# Patient Record
Sex: Male | Born: 1980 | Race: Black or African American | Hispanic: No | Marital: Married | Smoking: Never smoker
Health system: Southern US, Community
[De-identification: ages and names within clinical notes are randomized; demographics above are authoritative.]

## PROBLEM LIST (undated history)

## (undated) DIAGNOSIS — D352 Benign neoplasm of pituitary gland: Secondary | ICD-10-CM

---

## 2016-09-06 DIAGNOSIS — Z23 Encounter for immunization: Secondary | ICD-10-CM | POA: Diagnosis not present

## 2016-09-06 DIAGNOSIS — Z0000 Encounter for general adult medical examination without abnormal findings: Secondary | ICD-10-CM | POA: Diagnosis not present

## 2019-11-04 ENCOUNTER — Inpatient Hospital Stay (HOSPITAL_COMMUNITY)
Admission: EM | Admit: 2019-11-04 | Discharge: 2019-11-07 | DRG: 645 | Disposition: A | Discharge status: Home / Self Care | Payer: No Typology Code available for payment source | Attending: Internal Medicine | Admitting: Internal Medicine

## 2019-11-04 ENCOUNTER — Other Ambulatory Visit: Payer: Self-pay

## 2019-11-04 ENCOUNTER — Encounter (HOSPITAL_COMMUNITY): Payer: Self-pay | Admitting: *Deleted

## 2019-11-04 DIAGNOSIS — D352 Benign neoplasm of pituitary gland: Secondary | ICD-10-CM | POA: Diagnosis not present

## 2019-11-04 DIAGNOSIS — R519 Headache, unspecified: Secondary | ICD-10-CM | POA: Diagnosis present

## 2019-11-04 DIAGNOSIS — Z791 Long term (current) use of non-steroidal anti-inflammatories (NSAID): Secondary | ICD-10-CM

## 2019-11-04 DIAGNOSIS — H4901 Third [oculomotor] nerve palsy, right eye: Secondary | ICD-10-CM | POA: Diagnosis present

## 2019-11-04 DIAGNOSIS — H02401 Unspecified ptosis of right eyelid: Secondary | ICD-10-CM | POA: Diagnosis present

## 2019-11-04 DIAGNOSIS — E236 Other disorders of pituitary gland: Secondary | ICD-10-CM | POA: Diagnosis present

## 2019-11-04 DIAGNOSIS — Z20822 Contact with and (suspected) exposure to covid-19: Secondary | ICD-10-CM | POA: Diagnosis present

## 2019-11-04 DIAGNOSIS — Z01818 Encounter for other preprocedural examination: Secondary | ICD-10-CM

## 2019-11-04 DIAGNOSIS — G9389 Other specified disorders of brain: Secondary | ICD-10-CM

## 2019-11-04 HISTORY — DX: Benign neoplasm of pituitary gland: D35.2

## 2019-11-04 MED ORDER — TETRACAINE HCL 0.5 % OP SOLN
2.0000 [drp] | Freq: Once | OPHTHALMIC | Status: AC
  Administered 2019-11-05: 2 [drp] via OPHTHALMIC
  Filled 2019-11-04: qty 4

## 2019-11-04 MED ORDER — FLUORESCEIN SODIUM 1 MG OP STRP
1.0000 | ORAL_STRIP | Freq: Once | OPHTHALMIC | Status: AC
  Administered 2019-11-05: 1 via OPHTHALMIC

## 2019-11-04 NOTE — ED Triage Notes (Signed)
Pt says that he is currently being treated for a sinus infection, on abx. Says that on Sunday he had some sensitivity to light in the right eye. Since Monday, he says he cannot keep his eye open, unless he covers his left eye-the right eye will open naturally. Double vision if he has both eyes open. Right sided headache

## 2019-11-05 ENCOUNTER — Emergency Department (HOSPITAL_COMMUNITY): Payer: No Typology Code available for payment source

## 2019-11-05 ENCOUNTER — Inpatient Hospital Stay (HOSPITAL_COMMUNITY): Payer: No Typology Code available for payment source

## 2019-11-05 ENCOUNTER — Encounter (HOSPITAL_COMMUNITY): Payer: Self-pay | Admitting: Internal Medicine

## 2019-11-05 DIAGNOSIS — H4901 Third [oculomotor] nerve palsy, right eye: Secondary | ICD-10-CM | POA: Diagnosis present

## 2019-11-05 DIAGNOSIS — E236 Other disorders of pituitary gland: Secondary | ICD-10-CM | POA: Diagnosis not present

## 2019-11-05 DIAGNOSIS — H02401 Unspecified ptosis of right eyelid: Secondary | ICD-10-CM | POA: Diagnosis present

## 2019-11-05 DIAGNOSIS — G9389 Other specified disorders of brain: Secondary | ICD-10-CM | POA: Diagnosis not present

## 2019-11-05 DIAGNOSIS — D352 Benign neoplasm of pituitary gland: Secondary | ICD-10-CM | POA: Diagnosis present

## 2019-11-05 DIAGNOSIS — R519 Headache, unspecified: Secondary | ICD-10-CM | POA: Diagnosis present

## 2019-11-05 DIAGNOSIS — Z20822 Contact with and (suspected) exposure to covid-19: Secondary | ICD-10-CM | POA: Diagnosis present

## 2019-11-05 DIAGNOSIS — Z791 Long term (current) use of non-steroidal anti-inflammatories (NSAID): Secondary | ICD-10-CM | POA: Diagnosis not present

## 2019-11-05 LAB — BASIC METABOLIC PANEL
Anion gap: 13 (ref 5–15)
BUN: 11 mg/dL (ref 6–20)
CO2: 26 mmol/L (ref 22–32)
Calcium: 9.5 mg/dL (ref 8.9–10.3)
Chloride: 97 mmol/L — ABNORMAL LOW (ref 98–111)
Creatinine, Ser: 1.04 mg/dL (ref 0.61–1.24)
GFR calc Af Amer: >60 mL/min (ref >60)
GFR calc non Af Amer: >60 mL/min (ref >60)
Glucose, Bld: 107 mg/dL — ABNORMAL HIGH (ref 70–99)
Potassium: 3.9 mmol/L (ref 3.5–5.1)
Sodium: 136 mmol/L (ref 135–145)

## 2019-11-05 LAB — CBC WITH DIFFERENTIAL/PLATELET
Abs Immature Granulocytes: 0.01 10*3/uL (ref 0.00–0.07)
Basophils Absolute: 0.1 10*3/uL (ref 0.0–0.1)
Basophils Relative: 1 %
Eosinophils Absolute: 0.2 10*3/uL (ref 0.0–0.5)
Eosinophils Relative: 3 %
HCT: 45.1 % (ref 39.0–52.0)
Hemoglobin: 15.6 g/dL (ref 13.0–17.0)
Immature Granulocytes: 0 %
Lymphocytes Relative: 47 %
Lymphs Abs: 2.7 10*3/uL (ref 0.7–4.0)
MCH: 31.8 pg (ref 26.0–34.0)
MCHC: 34.6 g/dL (ref 30.0–36.0)
MCV: 92 fL (ref 80.0–100.0)
Monocytes Absolute: 0.5 10*3/uL (ref 0.1–1.0)
Monocytes Relative: 8 %
Neutro Abs: 2.4 10*3/uL (ref 1.7–7.7)
Neutrophils Relative %: 41 %
Platelets: 189 10*3/uL (ref 150–400)
RBC: 4.9 MIL/uL (ref 4.22–5.81)
RDW: 11.8 % (ref 11.5–15.5)
WBC: 5.7 10*3/uL (ref 4.0–10.5)
nRBC: 0 % (ref 0.0–0.2)

## 2019-11-05 LAB — CSF CELL COUNT WITH DIFFERENTIAL
RBC Count, CSF: 1 /mm3 — ABNORMAL HIGH
RBC Count, CSF: 4 /mm3 — ABNORMAL HIGH
Tube #: 1
Tube #: 4
WBC, CSF: 1 /mm3 (ref 0–5)
WBC, CSF: 2 /mm3 (ref 0–5)

## 2019-11-05 LAB — CORTISOL: Cortisol, Plasma: 4.2 ug/dL

## 2019-11-05 LAB — GLUCOSE, CSF: Glucose, CSF: 61 mg/dL (ref 40–70)

## 2019-11-05 LAB — TSH: TSH: 2.221 u[IU]/mL (ref 0.350–4.500)

## 2019-11-05 LAB — PROTEIN, CSF: Total  Protein, CSF: 33 mg/dL (ref 15–45)

## 2019-11-05 LAB — SEDIMENTATION RATE: Sed Rate: 5 mm/hr (ref 0–16)

## 2019-11-05 LAB — C-REACTIVE PROTEIN: CRP: 0.6 mg/dL (ref <1.0)

## 2019-11-05 LAB — SARS CORONAVIRUS 2 BY RT PCR (HOSPITAL ORDER, PERFORMED IN ~~LOC~~ HOSPITAL LAB): SARS Coronavirus 2: NEGATIVE

## 2019-11-05 LAB — HIV ANTIBODY (ROUTINE TESTING W REFLEX): HIV Screen 4th Generation wRfx: NONREACTIVE

## 2019-11-05 MED ORDER — ACETAMINOPHEN 325 MG PO TABS: 650.0000 mg | ORAL_TABLET | Freq: Four times a day (QID) | ORAL | Status: DC | PRN

## 2019-11-05 MED ORDER — IOHEXOL 350 MG/ML SOLN
75.0000 mL | Freq: Once | INTRAVENOUS | Status: AC | PRN
  Administered 2019-11-05: 75 mL via INTRAVENOUS

## 2019-11-05 MED ORDER — OXYCODONE HCL 5 MG PO TABS: 5.0000 mg | ORAL_TABLET | ORAL | Status: DC | PRN

## 2019-11-05 MED ORDER — ACETAMINOPHEN 650 MG RE SUPP: 650.0000 mg | Freq: Four times a day (QID) | RECTAL | Status: DC | PRN

## 2019-11-05 MED ORDER — MORPHINE SULFATE (PF) 2 MG/ML IV SOLN: 2.0000 mg | INTRAVENOUS | Status: DC | PRN

## 2019-11-05 MED ORDER — ONDANSETRON HCL 4 MG PO TABS: 4.0000 mg | ORAL_TABLET | Freq: Four times a day (QID) | ORAL | Status: DC | PRN

## 2019-11-05 MED ORDER — ONDANSETRON HCL 4 MG/2ML IJ SOLN: 4.0000 mg | Freq: Four times a day (QID) | INTRAMUSCULAR | Status: DC | PRN

## 2019-11-05 MED ORDER — TUBERCULIN PPD 5 UNIT/0.1ML ID SOLN
5.0000 [IU] | Freq: Once | INTRADERMAL | Status: AC
  Administered 2019-11-05: 5 [IU] via INTRADERMAL
  Filled 2019-11-05: qty 0.1

## 2019-11-05 MED ORDER — GADOBUTROL 1 MMOL/ML IV SOLN
9.0000 mL | Freq: Once | INTRAVENOUS | Status: AC | PRN
  Administered 2019-11-05: 9 mL via INTRAVENOUS

## 2019-11-05 MED ORDER — PREDNISONE 5 MG PO TABS: 10.0000 mg | ORAL_TABLET | Freq: Every day | ORAL | Status: DC

## 2019-11-05 MED ORDER — PREDNISONE 5 MG PO TABS
10.0000 mg | ORAL_TABLET | Freq: Every day | ORAL | Status: DC
  Administered 2019-11-05: 10 mg via ORAL
  Filled 2019-11-05: qty 2

## 2019-11-05 MED ORDER — PREDNISONE 5 MG PO TABS
10.0000 mg | ORAL_TABLET | Freq: Three times a day (TID) | ORAL | Status: DC
  Administered 2019-11-05 – 2019-11-07 (×6): 10 mg via ORAL
  Filled 2019-11-05 (×6): qty 2

## 2019-11-05 NOTE — ED Notes (Signed)
Pt and his wife both given apple juice to drink. Ok per provider.

## 2019-11-05 NOTE — ED Notes (Signed)
Patient currently at MRI

## 2019-11-05 NOTE — H&P (Signed)
History and Physical    Frederick Mendez [id number] DOB: [birth year] DOA: 11/04/2019  PCP: Seward Carol, MD Consultants:  None Patient coming from:  Home - lives with wife and 3 kids (23, 22, 1); NOK: Wife, Aboubacar Reichner, [phone]  Chief Complaint: Diplopia  HPI: Frederick Mendez is a 39 y.o. male with no significant known medical history presenting with diplopia.  He reports noticing his R eyelid drooping for the last couple of days.  He also then noticed diplopia when both eyes are are open.  Denies HA or other symptoms.  He thought he had a sinus infection and went to the doctor with fever, chills, cough, congestion, sinus pressure; he was given Amoxicillin with some improvement.     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  Neurology is consulting and recommends medicine admission and steroids.  LP is being done now.  Review of Systems: As per HPI; otherwise review of systems reviewed and negative.   Ambulatory Status: Ambulates without assistance   Past Medical History:  Diagnosis Date  . Pituitary adenoma ([place])     History reviewed. No pertinent surgical history.  Social History   Socioeconomic History  . Marital status: Married    Spouse name: Not on file  . Number of children: Not on file  . Years of education: Not on file  . Highest education level: Not on file  Occupational History  . Occupation: IT  Tobacco Use  . Smoking status: Never Smoker  . Smokeless tobacco: Never Used  Substance and Sexual Activity  . Alcohol use: Yes    Comment: social  . Drug use: Not Currently  . Sexual activity: Not on file  Other Topics Concern  . Not on file  Social History Narrative  . Not on file   Social Determinants of Health   Financial Resource Strain:   . Difficulty of Paying Living Expenses:   Food Insecurity:   . Worried About Charity fundraiser in the Last Year:   . Arboriculturist in the Last Year:     Transportation Needs:   . Film/video editor (Medical):   Marland Kitchen Lack of Transportation (Non-Medical):   Physical Activity:   . Days of Exercise per Week:   . Minutes of Exercise per Session:   Stress:   . Feeling of Stress :   Social Connections:   . Frequency of Communication with Friends and Family:   . Frequency of Social Gatherings with Friends and Family:   . Attends Religious Services:   . Active Member of Clubs or Organizations:   . Attends Archivist Meetings:   Marland Kitchen Marital Status:   Intimate Partner Violence:   . Fear of Current or Ex-Partner:   . Emotionally Abused:   Marland Kitchen Physically Abused:   . Sexually Abused:     No Known Allergies  History reviewed. No pertinent family history.  Prior to Admission medications   Medication Sig Start Date End Date Taking? Authorizing Provider  amoxicillin (AMOXIL) 500 MG capsule Take 500 mg by mouth 2 (two) times daily. 11/02/19  Yes [provider]  ibuprofen (ADVIL) 200 MG tablet Take 400 mg by mouth 2 (two) times daily as needed for headache.   Yes [provider]    Physical Exam: Vitals:   11/05/19 0700 11/05/19 0800 11/05/19 1437 11/05/19 1503  BP: 132/85 (!) 141/90 128/89 127/89  Pulse: 69 78 70 63  Resp:  13 20 15 14   Temp:   99.2 F (37.3 C) 99.6 F (37.6 C)  TempSrc:   Oral Oral  SpO2: 96% 98% 99% 100%  Weight:    86.9 kg  Height:    5\' 11"  (1.803 m)     . General: Lying flat s/p LP, prefers to keep eyes closed . Eyes:  R eye with lateral and downward gaze when open, +ptosis on the R eyelid . ENT:  grossly normal hearing, lips & tongue, mmm; appropriate dentition . Neck:  no LAD, masses or thyromegaly . Cardiovascular:  RRR, no m/r/g. No LE edema.  Marland Kitchen Respiratory:   CTA bilaterally with no wheezes/rales/rhonchi.  Normal respiratory effort. . Abdomen:  soft, NT, ND, NABS . Skin:  no rash or induration seen on limited exam . Musculoskeletal:  grossly normal tone BUE/BLE, good ROM, no  bony abnormality . Psychiatric:  blunted mood and affect, speech fluent and appropriate, AOx3 . Neurologic:  CN 2-12 grossly intact, moves all extremities in coordinated fashion    Radiological Exams on Admission: MR Brain W and Wo Contrast  Result Date: 11/05/2019 CLINICAL DATA:  Light sensitivity in the right eye. Being treated for sinus infection. Double vision and right-sided headache. EXAM: MRI HEAD AND ORBITS WITHOUT AND WITH CONTRAST TECHNIQUE: Multiplanar, multiecho pulse sequences of the brain and surrounding structures were obtained without and with intravenous contrast. Multiplanar, multiecho pulse sequences of the orbits and surrounding structures were obtained including fat saturation techniques, before and after intravenous contrast administration. CONTRAST:  45mL GADAVIST GADOBUTROL 1 MMOL/ML IV SOLN COMPARISON:  None. FINDINGS: MRI HEAD FINDINGS Brain: 18 x 16 x 13 mm sellar and suprasellar mass that is indistinguishable from normal pituitary gland. The mass is enhancing and has a wasted appearance. The mass contacts the optic chiasm in the suprasellar cistern. No visible cavernous sinus invasion to correlate with double vision. No notable heterogeneity to suggest recent hemorrhage. No acute infarction, hemorrhage, hydrocephalus, extra-axial collection or white matter disease. Vascular: These normal flow voids and vascular enhancement Skull and upper cervical spine: Normal marrow signal MRI ORBITS FINDINGS Orbits:  No evidence of mass, inflammation, or venous occlusion. Visualized sinuses: Opacified right posterior ethmoid air cell and partial opacification of the left frontal sinus. Soft tissues: Negative Limited intracranial: Negative IMPRESSION: 1. 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 The mass contacts the optic chiasm. No visible cavernous sinus invasion. 2. Normal appearance of the brain itself. Electronically Signed   By: Monte Fantasia M.D.   On:  11/05/2019 05:46   DG CHEST PORT 1 VIEW  Result Date: 11/05/2019 CLINICAL DATA:  Progressive headache and diplopia. Reported pituitary mass. Preoperative assessment. EXAM: PORTABLE CHEST 1 VIEW COMPARISON:  None. FINDINGS: The lungs are clear. Heart is borderline enlarged with pulmonary vascularity normal. No adenopathy. No bone lesions. IMPRESSION: Borderline cardiac prominence.  Lungs clear.  No adenopathy evident. Electronically Signed   By: Lowella Grip III M.D.   On: 11/05/2019 10:15   MR MRV HEAD W WO CONTRAST  Result Date: 11/05/2019 CLINICAL DATA:  Double vision and headache. Being treated for sinus infection. EXAM: MR VENOGRAM HEAD WITHOUT AND WITH CONTRAST TECHNIQUE: Angiographic images of the intracranial venous structures were obtained using MRV technique without and with intravenous contrast. CONTRAST:  53mL GADAVIST GADOBUTROL 1 MMOL/ML IV SOLN COMPARISON:  None. FINDINGS: Normal enhancement of the dural venous sinuses and major deep veins. There is symmetric cavernous sinus enhancement and patent superior ophthalmic  veins in this patient with sellar mass. IMPRESSION: Negative MRV. Electronically Signed   By: Monte Fantasia M.D.   On: 11/05/2019 05:48   MR ORBITS W WO CONTRAST  Result Date: 11/05/2019 CLINICAL DATA:  Light sensitivity in the right eye. Being treated for sinus infection. Double vision and right-sided headache. EXAM: MRI HEAD AND ORBITS WITHOUT AND WITH CONTRAST TECHNIQUE: Multiplanar, multiecho pulse sequences of the brain and surrounding structures were obtained without and with intravenous contrast. Multiplanar, multiecho pulse sequences of the orbits and surrounding structures were obtained including fat saturation techniques, before and after intravenous contrast administration. CONTRAST:  78mL GADAVIST GADOBUTROL 1 MMOL/ML IV SOLN COMPARISON:  None. FINDINGS: MRI HEAD FINDINGS Brain: 18 x 16 x 13 mm sellar and suprasellar mass that is indistinguishable from normal  pituitary gland. The mass is enhancing and has a wasted appearance. The mass contacts the optic chiasm in the suprasellar cistern. No visible cavernous sinus invasion to correlate with double vision. No notable heterogeneity to suggest recent hemorrhage. No acute infarction, hemorrhage, hydrocephalus, extra-axial collection or white matter disease. Vascular: These normal flow voids and vascular enhancement Skull and upper cervical spine: Normal marrow signal MRI ORBITS FINDINGS Orbits:  No evidence of mass, inflammation, or venous occlusion. Visualized sinuses: Opacified right posterior ethmoid air cell and partial opacification of the left frontal sinus. Soft tissues: Negative Limited intracranial: Negative IMPRESSION: 1. 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 The mass contacts the optic chiasm. No visible cavernous sinus invasion. 2. Normal appearance of the brain itself. Electronically Signed   By: Monte Fantasia M.D.   On: 11/05/2019 05:46    EKG: Independently reviewed.  NSR with rate 75; nonspecific ST changes, likely early repolarization    Labs on Admission: I have personally reviewed the available labs and imaging studies at the time of the admission.  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  DVT prophylaxis:   SCDs Code Status:  Full - confirmed with patient Family Communication: None present; I left a message for his wife on voice mail at the time of admission Disposition Plan:  The patient is from: home  Anticipated d/c is to: home without [facility] services   Anticipated d/c date will depend on clinical response to treatment, but possibly as early as tomorrow if he has excellent response to treatment  Patient is currently: acutely ill Consults called: Neurosurgery; neurology Admission status:  Admit - It is my clinical opinion that admission to INPATIENT is reasonable and necessary because of the expectation that this patient will require hospital care that crosses at least 2 midnights to treat this condition based on the medical complexity of the problems presented.  Given the aforementioned information, the predictability of an adverse  outcome is felt to be significant.    Karmen Bongo MD [facility]   How to contact the [facility] Attending or Consulting provider [place] or covering provider during after hours [place], for this patient?  1. Check the care team in [facility] and look for a) attending/consulting [facility] provider listed and b) the [facility] team listed 2. Log into [email or web address] and use [place]'s universal password to access. If you do not have the password, please contact the hospital operator. 3. Locate the [facility] provider you are looking for under [facility] and page to a number that you can be directly reached. 4. If you still have difficulty reaching the provider, please page the [facility] (Director on Call) for the Hospitalists listed on amion for assistance.   11/05/2019, 3:22 PM

## 2019-11-05 NOTE — ED Notes (Signed)
Patient returned from MRI , denies pain/respirations unlabored , family at bedside , neurologist explained plan of care to patient .

## 2019-11-05 NOTE — Progress Notes (Signed)
TB test applied to left anterior forearm. Site marked. Delia Heady RN

## 2019-11-05 NOTE — ED Provider Notes (Signed)
TIME SEEN: 1:30 AM  CHIEF COMPLAINT: ptosis, diplopia  HPI: Patient is a 39 year old otherwise healthy male who presents to the emergency department with 3 days of right-sided ptosis, binocular diplopia.  Reports last week he had a sinus infection that was treated with amoxicillin.  States he had fevers, cough, congestion, sinus pressure.  Was tested for Covid and it was negative.  Reports last fever was on Sunday and he started feeling better and then the symptoms started on Monday.  States that he feels his vision is normal when either of his eyes are closed but when they are open he has having central diplopia.  No vision loss.  No eye pain or drainage.  Feels some "tension" above the right eyebrow but denies headache.  No head injury.  Not on blood thinners.  No other numbness, tingling or focal weakness.  No history of autoimmune disorder, stroke.  ROS: See HPI Constitutional: no fever  Eyes: no drainage  ENT: no runny nose   Cardiovascular:  no chest pain  Resp: no SOB  GI: no vomiting GU: no dysuria Integumentary: no rash  Allergy: no hives  Musculoskeletal: no leg swelling  Neurological: no slurred speech ROS otherwise negative  PAST MEDICAL HISTORY/PAST SURGICAL HISTORY:  History reviewed. No pertinent past medical history.  MEDICATIONS:  Prior to Admission medications   Not on File    ALLERGIES:  Not on File  SOCIAL HISTORY:  Social History   Tobacco Use  . Smoking status: Never Smoker  Substance Use Topics  . Alcohol use: Yes    FAMILY HISTORY: No family history on file.  EXAM: BP (!) 146/98 (BP Location: Right Arm)   Pulse 91   Temp 98.8 F (37.1 C) (Oral)   Resp 16   SpO2 100%  CONSTITUTIONAL: Alert and oriented and responds appropriately to questions. Well-appearing; well-nourished HEAD: Normocephalic EYES: Conjunctivae clear, pupils appear equal and approximately 4 mm bilaterally and reactive, patient has right ptosis, patient has some difficulty  moving the right eye medially and cannot move it completely to the center but no other extraocular movement abnormality, no hyphema or hypopyon, no subconjunctival hemorrhage, no chemosis, no sign of proptosis, please see nursing notes for visual acuity, no visual field deficits, optic disc appears normal ENT: normal nose; moist mucous membranes, no angioedema, normal phonation, no tenderness over the face and no soft tissue swelling, redness or warmth NECK: Supple, normal ROM, no lymphadenopathy CARD: RRR; S1 and S2 appreciated; no murmurs, no clicks, no rubs, no gallops RESP: Normal chest excursion without splinting or tachypnea; breath sounds clear and equal bilaterally; no wheezes, no rhonchi, no rales, no hypoxia or respiratory distress, speaking full sentences ABD/GI: Normal bowel sounds; non-distended; soft, non-tender, no rebound, no guarding, no peritoneal signs, no hepatosplenomegaly BACK:  The back appears normal EXT: Normal ROM in all joints; no deformity noted, no edema; no cyanosis SKIN: Normal color for age and race; warm; no rash on exposed skin NEURO: Moves all extremities equally, sensation to light touch intact diffusely, strength 5/5 in all 4 extremities, normal speech, other than ptosis and ophthalmoplegia of the right eye the cranial nerves II through XII are intact, no facial asymmetry PSYCH: The patient's mood and manner are appropriate.   MEDICAL DECISION MAKING: Patient here with ptosis, ophthalmoplegia, diplopia.  Differential includes stroke, CVT, myasthenia gravis, structural lesion, infection.  Discussed with Dr. Cheral Marker with neurology and Dr. Jeannine Boga with radiology.  At this time recommend obtaining MRI of the brain with and  without contrast, MRV of the brain with and without contrast and MRI of the orbits with and without contrast for further evaluation.  Labs have been obtained and reviewed/interpreted and show no acute abnormality.  He is afebrile, nontoxic.  No  leukocytosis.  Interestingly, patient's ptosis seems to improved significantly after he has applied ice to his eyelid for about 15 minutes.  He has no known history of myasthenia gravis or family history of the same.  He denies any difficulty breathing, swallowing or speaking.  If this is a stroke, patient would be outside of TPA window given symptoms have been ongoing since Monday.  ED PROGRESS: 6:25 AM  Pt's MRI does show an 18 x 16 x 13 sellar and suprasellar mass consistent with a macroadenoma that is indistinguishable from the pituitary gland.  The mass contacts the optic chiasm but does not obviously enter the cavernous sinus.  Dr. Cheral Marker with neurology has seen the patient and feels that CN III is being affected however.  He recommends neurosurgery consultation for debulking surgery.  Discussed with Margo Aye, NP on-call for neurosurgery.  They will see patient in the ED today.  We will keep him n.p.o. at this time and obtain Covid swab.  Appreciate neurology and neurosurgery assistance.  Updated patient and family at bedside.    EKG Interpretation  Date/Time:  Friday Nov 05 2019 03:00:41 EDT Ventricular Rate:  75 PR Interval:    QRS Duration: 99 QT Interval:  381 QTC Calculation: 426 R Axis:   50 Text Interpretation: Sinus rhythm Probable left atrial enlargement RSR' in V1 or V2, right VCD or RVH ST elev, probable normal early repol pattern No old tracing to compare Confirmed by Everet Flagg, Cyril Mourning [phone]) on 11/05/2019 3:04:47 AM         EKG Interpretation  Date/Time:  Friday Nov 05 2019 03:00:41 EDT Ventricular Rate:  75 PR Interval:    QRS Duration: 99 QT Interval:  381 QTC Calculation: 426 R Axis:   50 Text Interpretation: Sinus rhythm Probable left atrial enlargement RSR' in V1 or V2, right VCD or RVH ST elev, probable normal early repol pattern No old tracing to compare Confirmed by Maclain Cohron, Cyril Mourning [phone]) on 11/05/2019 3:04:47 AM       CRITICAL CARE Performed by: Cyril Mourning  Kendell Gammon   Total critical care time: 60 minutes  Critical care time was exclusive of separately billable procedures and treating other patients.  Critical care was necessary to treat or prevent imminent or life-threatening deterioration.  Critical care was time spent personally by me on the following activities: development of treatment plan with patient and/or surrogate as well as nursing, discussions with consultants, evaluation of patient's response to treatment, examination of patient, obtaining history from patient or surrogate, ordering and performing treatments and interventions, ordering and review of laboratory studies, ordering and review of radiographic studies, pulse oximetry and re-evaluation of patient's condition.   Cainen Ricco was evaluated in Emergency Department on 11/05/2019 for the symptoms described in the history of present illness. He was evaluated in the context of the global COVID-19 pandemic, which necessitated consideration that the patient might be at risk for infection with the SARS-CoV-2 virus that causes COVID-19. Institutional protocols and algorithms that pertain to the evaluation of patients at risk for COVID-19 are in a state of rapid change based on information released by regulatory bodies including the CDC and federal and state organizations. These policies and algorithms were followed during the patient's care in the ED.  Darion Milewski, Delice Bison, DO 11/05/19 [phone]

## 2019-11-05 NOTE — ED Provider Notes (Signed)
  Provider Note MRN:  [id number]  Arrival date & time: 11/05/19    ED Course and Medical Decision Making  Assumed care from Dr. Leonides Schanz at shift change.  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  Neurology has updated the recommendations, they have concern for hypophysitis and are now recommending hospitalist admission.  The requested lumbar puncture, performed as described below.  Neurosurgery will follow in consultation.  Accepted for admission by Dr. Karmen Bongo.  .Lumbar Puncture  Date/Time: 11/05/2019 9:12 AM Performed by: Maudie Flakes, MD Authorized by: Maudie Flakes, MD   Consent:    Consent obtained:  Verbal and written   Consent given by:  Patient   Risks discussed:  Bleeding, headache, infection, nerve damage, pain and repeat procedure Pre-procedure details:    Procedure purpose:  Diagnostic   Preparation: Patient was prepped and draped in usual sterile fashion   Anesthesia (see MAR for exact dosages):    Anesthesia method:  Local infiltration   Local anesthetic:  Lidocaine 1% w/o epi Procedure details:    Lumbar space:  L4-L5 interspace   Patient position:  L lateral decubitus   Needle gauge:  18   Needle length (in):  3.5   Ultrasound guidance: no     Number of attempts:  1   Fluid appearance:  Clear   Tubes of fluid:  4   Total volume (ml):  12 Post-procedure:    Puncture site:  Adhesive bandage applied   Patient tolerance of procedure:  Tolerated well, no immediate complications .Critical Care Performed by: Maudie Flakes, MD Authorized by: Maudie Flakes, MD   Critical care provider statement:    Critical care time (minutes):  32   Critical care was necessary to treat or prevent imminent or life-threatening deterioration of the following conditions:  CNS failure or compromise   Critical care was time spent personally by me on the following activities:  Discussions with consultants, evaluation  of patient's response to treatment, examination of patient, ordering and performing treatments and interventions, ordering and review of laboratory studies, ordering and review of radiographic studies, pulse oximetry, re-evaluation of patient's condition, obtaining history from patient or surrogate and review of old charts   I assumed direction of critical care for this patient from another provider in my specialty: yes      Final Clinical Impressions(s) / ED Diagnoses     ICD-10-CM   1. 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 Sedonia Small, [place] [email or web address]    Maudie Flakes, MD 11/05/19 [phone]

## 2019-11-05 NOTE — Progress Notes (Signed)
Neurosurgery Service Progress Note  Subjective: Assuming care from Dr. Ronnald Ramp from a neurosurgical perspective. HA + R ptosis progressive over 1wk with photosensitivity OD and slowly progressive diplopia on left gaze.    Objective: Vitals:   11/05/19 0600 11/05/19 0630 11/05/19 0700 11/05/19 0800  BP: (!) 137/97 (!) 134/99 132/85 (!) 141/90  Pulse: 68 64 69 78  Resp: 10 12 13 20   Temp:      TempSrc:      SpO2: 97% 97% 96% 98%   Temp (24hrs), Avg:98 F (36.7 C), Min:97.2 F (36.2 C), Max:98.8 F (37.1 C)  CBC Latest Ref Rng & Units 11/05/2019  WBC 4.0 - 10.5 K/uL 5.7  Hemoglobin 13.0 - 17.0 g/dL 15.6  Hematocrit 39.0 - 52.0 % 45.1  Platelets 150 - 400 K/uL 189   BMP Latest Ref Rng & Units 11/05/2019  Glucose 70 - 99 mg/dL 107(H)  BUN 6 - 20 mg/dL 11  Creatinine 0.61 - 1.24 mg/dL 1.04  Sodium 135 - 145 mmol/L 136  Potassium 3.5 - 5.1 mmol/L 3.9  Chloride 98 - 111 mmol/L 97(L)  CO2 22 - 32 mmol/L 26  Calcium 8.9 - 10.3 mg/dL 9.5   No intake or output data in the 24 hours ending 11/05/19 0928 No current facility-administered medications for this encounter.  Current Outpatient Medications:  .  amoxicillin (AMOXIL) 500 MG capsule, Take 500 mg by mouth 2 (two) times daily., Disp: , Rfl:  .  ibuprofen (ADVIL) 200 MG tablet, Take 400 mg by mouth 2 (two) times daily as needed for headache., Disp: , Rfl:    Physical Exam: AOx3, PERR with significant ptosis OD, gaze conjugate when neutral, but restricted medial rectus function OD with medial gaze, otherwise EOMI, face symmetric with symmetric sensation, VFF on confrontation, tongue midline, strength 5/5x4, SILTx4  Assessment & Plan: 40 y.o. man w/ 1wk progressive HA / ptosis / diplopia, exam with a pupil sparing right CN3 palsy with conjugate gaze at rest. 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 On non-con sequences, isointense on T1 and T2, homogenously enhancing, history inconsistent with hyperacute hemorrhage,  GRE with artifact due to skull base.   The tumor does not extend into the cavernous sinus and the extraocular CNs run in the lateral cavernous sinus, along with the sparing of the pupil in his CN3 palsy, this doesn't match his radiographic findings well. Since the tumor does contact the chiasm, I explained to him that it will eventually need to be resected, but we should first make sure we're not missing an alternative cause of his symptoms while focusing on the tumor, as it may be an incidental findings.  -draw pituitary panel, preferably before starting glucocorticoids -will continue to follow, if no other cause evident then will resect the tumor next week, otherwise will let him recover and resect it as an outpatient  Judith Part  11/05/19 9:28 AM

## 2019-11-05 NOTE — Consult Note (Signed)
NEURO HOSPITALIST CONSULT NOTE   Requestig physician: Dr. Leonides Schanz  Reason for Consult: Diplopia  History obtained from:  Patient and Chart    HPI:                                                                                                                                          Frederick Mendez is an 39 y.o. male who presents for evaluation of a 1 week history of progressive binocular double vision, right ptosis and retroorbital pain. He also had some sensitivity to light with his right eye. The patient's symptoms were initially suspected to be due to a sinus infection and ABX were started, which did not result in improvement of his right visual symptoms but was associated with some decrease in his sinusitis-like discomfort.    History reviewed. No pertinent past medical history.  History reviewed. No pertinent surgical history.  No family history on file.            Social History:  reports that he has never smoked. He does not have any smokeless tobacco history on file. He reports current alcohol use. He reports previous drug use.  Not on File  HOME MEDICATIONS:                                                                                                                      No current facility-administered medications on file prior to encounter.   Current Outpatient Medications on File Prior to Encounter  Medication Sig Dispense Refill  . amoxicillin (AMOXIL) 500 MG capsule Take 500 mg by mouth 2 (two) times daily.    Marland Kitchen ibuprofen (ADVIL) 200 MG tablet Take 400 mg by mouth 2 (two) times daily as needed for headache.       ROS:  As per HPI. Denies any other symptoms, including no limb weakness, limb numbness, or gait instablitiy.   Blood pressure (!) 149/94, pulse 67, temperature (!) 97.2 F (36.2 C), temperature source  Temporal, resp. rate 18, SpO2 99 %.   General Examination:                                                                                                       Physical Exam  HEENT-  [place]/AT. No periorbital swelling. Mild scleral injection bilaterally. No proptosis. Lungs- Respirations unlabored.  Extremities- No edema  Neurological Examination Mental Status: Alert and oriented, thought content appropriate.  Speech fluent without evidence of aphasia.  Able to follow all commands without difficulty. Cranial Nerves: II: Visual fields intact in all 4 quadrants of each eye. Visual acuity normal. Some light sensitivity to right eye. PERRL.   III,IV, VI:  Right  Eye: Prominent right ptosis. EOM are impaired in upgaze, downgaze and adduction.  Left eye: Eyelid normal on the left. EOM are normal.  V,VII: Smile symmetric, facial temp sensation equal bilaterally VIII: Hearing intact to voice IX,X: Palate rises symmetrically XI: Symmetric shoulder shrug XII: Midline tongue extension Motor: Right : Upper extremity   5/5    Left:     Upper extremity   5/5  Lower extremity   5/5     Lower extremity   5/5 No pronator drift Sensory: Temp and light touch intact throughout, bilaterally Deep Tendon Reflexes: 2+ and symmetric throughout Plantars: Right: downgoing   Left: downgoing Cerebellar: No ataxia with FNF bilaterally  Gait: Deferred.    Lab Results: Basic Metabolic Panel: No results for input(s): NA, K, CL, CO2, GLUCOSE, BUN, CREATININE, CALCIUM, MG, PHOS in the last 168 hours.  CBC: Recent Labs  Lab 11/05/19 0141  WBC 5.7  NEUTROABS 2.4  HGB 15.6  HCT 45.1  MCV 92.0  PLT 189    Cardiac Enzymes: No results for input(s): CKTOTAL, CKMB, CKMBINDEX, TROPONINI in the last 168 hours.  Lipid Panel: No results for input(s): CHOL, TRIG, HDL, CHOLHDL, VLDL, LDLCALC in the last 168 hours.  Imaging:  MRI brain and orbits with and without contrast: 1. 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 The mass contacts the optic chiasm. No visible cavernous sinus invasion. 2. Normal appearance of the brain itself.  MRV head: Normal MRV. There is normal enhancement of the dural venous sinuses and major deep veins. There is symmetric cavernous sinus enhancement and patent superior ophthalmic veins in this patient with sellar mass.   Assessment: 39 year old male presenting with a one week history of bilateral retro-orbital pain and progressive right CN III palsy.  1. Exam findings are most consistent with a right CN III palsy due to mass effect from an adjacent lesion.  2. MRI brain reveals a sellar and suprasellar mass.  3. DDx for the sellar lesion includes pituitary adenoma and hypophysitis (lymphocytic versus granulomatous). Although hypophysitis is a rare mimic of pituitary adenoma, this component of the DDx is favored due to the  rapid progression of the patient's right CN III deficit as well as the retro-orbital pain.  Of note, granulomatous hypophysitis can be secondary to any systemic disease that can form granulomata, including sarcoidosis, syphilis and TB.   Recommendations: 1. RPR 2. Tuberculin skin test and serum tuberculosis PCR 3. ACE level and CXR 4. ESR and C-reactive protein.  5. TSH, morning cortisol level and other components of pituitary hormone panel 5. Consider LP to assess for possible inflammatory or infectious process. If obtaining LP, labs should include VDRL, TB PCR, cell count with differential, protein, glucose, AFB stain, fungal culture, bacterial culture, fungal stain and gram stain.  6. Monitor for possible development of diabetes insipidus.  7.  Consider empiric steroid treatment with 10 mg prednisone PO TID x 6 weeks followed by a slow taper.  8. Neurosurgery consult is pending. Would appreciate their second opinion regarding the DDx of the imaging findings and clinical features with which this patient presents.    Electronically signed: Dr. Kerney Elbe 11/05/2019, 2:17 AM

## 2019-11-05 NOTE — Progress Notes (Addendum)
Prednisone dosage regimen changed to 10 mg po TID from 10 mg po qd, per recommendations from the literature described in my initial consult note. Prednisone should be continued at 10 mg TID for 6 weeks, followed by a gradual taper. Special caution for possible Addisonian crisis if prednisone is tapered too quickly. Will need outpatient Endocrinology and Neurology follow ups.   A/R: 39 year old male with inflammatory hypophysitis versus infectious hypophysitis versus rapidly growing pituitary adenoma associated with new onset right oculomotor palsy.  1. CSF showed normal glucose, normal white count and normal protein. Although the spinal fluid so far is negative, this does not rule out an inflammatory hypophysitis as the pituitary is a relatively small structure relative to the overall volume of the CSF compartment (dilutional effects on inflammatory markers is conceivable).  2. Will need repeat MRI brain with and without contrast while in the tapering phase of his empiric steroid treatment to assess for possible interval change in the size and enhancement pattern of the pituitary gland. If there is improvement in his oculomotor palsy in conjunction with decreased pituitary size on MRI, then an inflammatory hypophysitis is most likely. If no improvement, then pituitary adenoma would be the most likely etiology, provided that TB skin testing and CSF infectious labs come back negative.   Electronically signed: Dr. Kerney Elbe

## 2019-11-05 NOTE — Consult Note (Signed)
Reason for Consult: pituitary macroadenoma Referring Physician: EDP  Frederick Mendez is an 39 y.o. male.   HPI:  39 year old male presents to the ED last night after complaining of a sinus like headache for over a week and droop eyelid on the right. He has had pressure in the right side of his head for over a week now and thought that it was just a sinus infection. On Monday he developed double vision and his right eyelid started drooping. He does think that this has gotten worse over the week. He does endorse sensitivity to light. Denies any other symptoms.   History reviewed. No pertinent past medical history.  History reviewed. No pertinent surgical history.  No Known Allergies  Social History   Tobacco Use  . Smoking status: Never Smoker  Substance Use Topics  . Alcohol use: Yes    No family history on file.   Review of Systems  Positive ROS: as above  All other systems have been reviewed and were otherwise negative with the exception of those mentioned in the HPI and as above.  Objective: Vital signs in last 24 hours: Temp:  [97.2 F (36.2 C)-98.8 F (37.1 C)] 97.2 F (36.2 C) (05/21 0149) Pulse Rate:  [64-91] 64 (05/21 0630) Resp:  [10-18] 12 (05/21 0630) BP: (126-149)/(71-99) 134/99 (05/21 0630) SpO2:  [96 %-100 %] 97 % (05/21 0630)  General Appearance: Alert, cooperative, no distress, appears stated age Head: Normocephalic, without obvious abnormality, atraumatic Eyes: PERRL, conjunctiva/corneas clear, fundi benign, both eyes, right third nerve palsy with difficulty looking inward and ptosis Lungs:  respirations unlabored Heart: Regular rate and rhythm Extremities: Extremities normal, atraumatic, no cyanosis or edema Pulses: 2+ and symmetric all extremities Skin: Skin color, texture, turgor normal, no rashes or lesions  NEUROLOGIC:   Mental status: A&O x4, no aphasia, good attention span, Memory and fund of knowledge Motor Exam - grossly normal, normal tone and  bulk Sensory Exam - grossly normal Reflexes: symmetric, no pathologic reflexes, No Hoffman's, No clonus Coordination - grossly normal Gait -not tested Balance - not tested Cranial Nerves: I: smell Not tested  II: visual acuity   double vision both eyes    II: visual fields Full to confrontation  II: pupils Equal, round, reactive to light  III,VII: ptosis Right eye  III,IV,VI: extraocular muscles  Right third nerve palsy  V: mastication Normal  V: facial light touch sensation  Normal  V,VII: corneal reflex  Present  VII: facial muscle function - upper  Normal  VII: facial muscle function - lower Normal  VIII: hearing Not tested  IX: soft palate elevation  Normal  IX,X: gag reflex Present  XI: trapezius strength  5/5  XI: sternocleidomastoid strength 5/5  XI: neck flexion strength  5/5  XII: tongue strength  Normal    Data Review Lab Results  Component Value Date   WBC 5.7 11/05/2019   HGB 15.6 11/05/2019   HCT 45.1 11/05/2019   MCV 92.0 11/05/2019   PLT 189 11/05/2019   Lab Results  Component Value Date   NA 136 11/05/2019   K 3.9 11/05/2019   CL 97 (L) 11/05/2019   CO2 26 11/05/2019   BUN 11 11/05/2019   CREATININE 1.04 11/05/2019   GLUCOSE 107 (H) 11/05/2019   No results found for: INR, PROTIME  Radiology: MR Brain W and Wo Contrast  Result Date: 11/05/2019 CLINICAL DATA:  Light sensitivity in the right eye. Being treated for sinus infection. Double vision and right-sided  headache. EXAM: MRI HEAD AND ORBITS WITHOUT AND WITH CONTRAST TECHNIQUE: Multiplanar, multiecho pulse sequences of the brain and surrounding structures were obtained without and with intravenous contrast. Multiplanar, multiecho pulse sequences of the orbits and surrounding structures were obtained including fat saturation techniques, before and after intravenous contrast administration. CONTRAST:  32mL GADAVIST GADOBUTROL 1 MMOL/ML IV SOLN COMPARISON:  None. FINDINGS: MRI HEAD FINDINGS Brain: 18 x  16 x 13 mm sellar and suprasellar mass that is indistinguishable from normal pituitary gland. The mass is enhancing and has a wasted appearance. The mass contacts the optic chiasm in the suprasellar cistern. No visible cavernous sinus invasion to correlate with double vision. No notable heterogeneity to suggest recent hemorrhage. No acute infarction, hemorrhage, hydrocephalus, extra-axial collection or white matter disease. Vascular: These normal flow voids and vascular enhancement Skull and upper cervical spine: Normal marrow signal MRI ORBITS FINDINGS Orbits:  No evidence of mass, inflammation, or venous occlusion. Visualized sinuses: Opacified right posterior ethmoid air cell and partial opacification of the left frontal sinus. Soft tissues: Negative Limited intracranial: Negative IMPRESSION: 1. 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 The mass contacts the optic chiasm. No visible cavernous sinus invasion. 2. Normal appearance of the brain itself. Electronically Signed   By: Monte Fantasia M.D.   On: 11/05/2019 05:46   MR MRV HEAD W WO CONTRAST  Result Date: 11/05/2019 CLINICAL DATA:  Double vision and headache. Being treated for sinus infection. EXAM: MR VENOGRAM HEAD WITHOUT AND WITH CONTRAST TECHNIQUE: Angiographic images of the intracranial venous structures were obtained using MRV technique without and with intravenous contrast. CONTRAST:  7mL GADAVIST GADOBUTROL 1 MMOL/ML IV SOLN COMPARISON:  None. FINDINGS: Normal enhancement of the dural venous sinuses and major deep veins. There is symmetric cavernous sinus enhancement and patent superior ophthalmic veins in this patient with sellar mass. IMPRESSION: Negative MRV. Electronically Signed   By: Monte Fantasia M.D.   On: 11/05/2019 05:48   MR ORBITS W WO CONTRAST  Result Date: 11/05/2019 CLINICAL DATA:  Light sensitivity in the right eye. Being treated for sinus infection. Double vision and right-sided headache.  EXAM: MRI HEAD AND ORBITS WITHOUT AND WITH CONTRAST TECHNIQUE: Multiplanar, multiecho pulse sequences of the brain and surrounding structures were obtained without and with intravenous contrast. Multiplanar, multiecho pulse sequences of the orbits and surrounding structures were obtained including fat saturation techniques, before and after intravenous contrast administration. CONTRAST:  72mL GADAVIST GADOBUTROL 1 MMOL/ML IV SOLN COMPARISON:  None. FINDINGS: MRI HEAD FINDINGS Brain: 18 x 16 x 13 mm sellar and suprasellar mass that is indistinguishable from normal pituitary gland. The mass is enhancing and has a wasted appearance. The mass contacts the optic chiasm in the suprasellar cistern. No visible cavernous sinus invasion to correlate with double vision. No notable heterogeneity to suggest recent hemorrhage. No acute infarction, hemorrhage, hydrocephalus, extra-axial collection or white matter disease. Vascular: These normal flow voids and vascular enhancement Skull and upper cervical spine: Normal marrow signal MRI ORBITS FINDINGS Orbits:  No evidence of mass, inflammation, or venous occlusion. Visualized sinuses: Opacified right posterior ethmoid air cell and partial opacification of the left frontal sinus. Soft tissues: Negative Limited intracranial: Negative IMPRESSION: 1. 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 The mass contacts the optic chiasm. No visible cavernous sinus invasion. 2. Normal appearance of the brain itself. Electronically Signed   By: Monte Fantasia M.D.   On: 11/05/2019 05:46  Assessment/Plan: 40 year old male presented to the ED last night with progressive weakness of his right eye and double vision since Monday with headaches since last week. 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 No pituitary apoplexy noted. Discussed this case with Dr. Zada Finders and he will  plan to see the patient and be taking over his care.   Ocie Cornfield Carlitos Bottino 11/05/2019 8:10 AM

## 2019-11-05 NOTE — Progress Notes (Signed)
Pt admitted to the unit from ED. Pt alert and verbally responsive with spouse at bedside. Pt and spouse oriented to the unit and room. Fall/safety precaution and prevention education completed. Both voices understanding and denies any questions. Pt LP site to back has band aid clean, dry and tact with no active bleeding or drainage noted. Pt and spouse he's on flat bedrest till 1700. Pt skin assessment completed with RN Levada Dy per protocol and no opened wounds or pressure ulcer noted. VSS, SCD applied BLE. Will continue to closely monitor. Bed alarm on. Francis Gaines Khilynn Borntreger RN   11/05/19 1503  Vitals  Temp 99.6 F (37.6 C)  Temp Source Oral  BP 127/89  MAP (mmHg) 100  BP Location Left Arm  BP Method Automatic  Patient Position (if appropriate) Lying  Pulse Rate 63  Pulse Rate Source Monitor  Resp 14  Oxygen Therapy  SpO2 100 %  O2 Device Room Air  Pain Assessment  Pain Scale 0-10  Pain Score 0  Height and Weight  Height 5\' 11"  (1.803 m)  Weight 86.9 kg  Type of Scale Used Bed  Type of Weight Actual  BSA (Calculated - sq m) 2.09 sq meters  BMI (Calculated) 26.73  Weight in (lb) to have BMI = 25 178.9  MEWS Score  MEWS Temp 0  MEWS Systolic 0  MEWS Pulse 0  MEWS RR 0  MEWS LOC 0  MEWS Score 0  MEWS Score Color Green

## 2019-11-06 LAB — BASIC METABOLIC PANEL WITH GFR
Anion gap: 9 (ref 5–15)
BUN: 15 mg/dL (ref 6–20)
CO2: 25 mmol/L (ref 22–32)
Calcium: 9.5 mg/dL (ref 8.9–10.3)
Chloride: 102 mmol/L (ref 98–111)
Creatinine, Ser: 1.22 mg/dL (ref 0.61–1.24)
GFR calc Af Amer: >60 mL/min
GFR calc non Af Amer: >60 mL/min
Glucose, Bld: 103 mg/dL — ABNORMAL HIGH (ref 70–99)
Potassium: 4.7 mmol/L (ref 3.5–5.1)
Sodium: 136 mmol/L (ref 135–145)

## 2019-11-06 LAB — CBC
HCT: 46.8 % (ref 39.0–52.0)
Hemoglobin: 16 g/dL (ref 13.0–17.0)
MCH: 31.6 pg (ref 26.0–34.0)
MCHC: 34.2 g/dL (ref 30.0–36.0)
MCV: 92.5 fL (ref 80.0–100.0)
Platelets: 215 10*3/uL (ref 150–400)
RBC: 5.06 MIL/uL (ref 4.22–5.81)
RDW: 11.9 % (ref 11.5–15.5)
WBC: 5.3 10*3/uL (ref 4.0–10.5)
nRBC: 0 % (ref 0.0–0.2)

## 2019-11-06 LAB — ANGIOTENSIN CONVERTING ENZYME: Angiotensin-Converting Enzyme: 29 U/L (ref 14–82)

## 2019-11-06 LAB — SYPHILIS: RPR W/REFLEX TO RPR TITER AND TREPONEMAL ANTIBODIES, TRADITIONAL SCREENING AND DIAGNOSIS ALGORITHM: RPR Ser Ql: NONREACTIVE

## 2019-11-06 NOTE — Progress Notes (Signed)
Patient ID: Frederick Mendez, male   DOB: [birth year], 39 y.o.   MRN: [id number] BP 126/77 (BP Location: Left Arm)   Pulse 67   Temp 98.6 F (37 C) (Oral)   Resp 16   Ht 5\' 11"  (1.803 m)   Wt 86.9 kg   SpO2 100%   BMI 26.72 kg/m  Alert and oriented x 4 Right lll nerve palsy Symmetric smile Awaiting a possible diagnosis May have surgery this week.  Good exam.

## 2019-11-06 NOTE — Plan of Care (Signed)
Imaging and lab results reviewed for this patient with an atypical pupil sparing 3rd nerve palsy. But nonetheless, the pituitary mass needs possible excision at some point. Hypophysitis affecting the peripheral part of 3rd nerve remains plausible etiology  Labs: ESR 5 CRP 0.6 Cortisol 4.2 TSH 2.2 ACE normal Quantiferon pending HIV negative CSF picture is bland and unlikely to yield an infectious etiology. RPR, quantiferon, VDRL pending - follow up test results.  CTA head with no evidence of vascular malformation or aneurysm. MRIs as earlier reviewed by Dr. 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  Recs: Continue steroids as advised by Dr. Cheral Marker Follow up with neurosurgery as advised by Dr. Zada Finders Follow up with neurology and endocrinology as outpatient in 2-4 weeks. Inpatient neurology will be available as needed.  -- Amie Portland, MD [facility]

## 2019-11-06 NOTE — Progress Notes (Signed)
PROGRESS NOTE  Frederick Mendez  DOB: Mar 31, 1981  PCP: Seward Carol, MD SLH:[id number]  DOA: 11/04/2019  LOS: 1 day   Chief Complaint  Patient presents with  . Eye Problem   Brief narrative: Frederick Mendez is a 39 y.o. male with no significant past medical history who presented to the ED on 5/20 with complaint of 1 week history of progressive binocular double vision, right ptosis and retroorbital pain.   Imagings were done in the ED.  MRI brain and orbits with and without contrast: 1. 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 The mass contacts the optic chiasm. No visible cavernous sinus invasion. 2. Normal appearance of the brain itself.  MRV head and CTA head normal.  Patient was seen by neurology and neurosurgery. Admitted to hospitalist service for further evaluation management.  Subjective: Patient was seen and examined this morning. Pleasant young African-American male.  Not in distress.  Continues to have right eye ptosis.  Wife at bedside. Chart reviewed.  Labs from this morning normal  Assessment/Plan: Pituitary adenoma -Presented with right eye ptosis, double vision and retro-orbital pain.   -Imaging findings as above. -Neurology and neurosurgery consult appreciated. -Per recommendations, need to rule out granulomatous hypophysitis secondary to systemic disease that can form granulomata including sarcoidosis, syphilis and TB.  Tests sent per neurology: RPR, tuberculin skin test, ACE level, chest x-ray, ESR, CRP, TSH, pituitary hormone panel, morning cortisol level. -ESR, CRP, ACE level, TSH level, chest x-ray normal.  HIV negative.  Plasma cortisol level seems low at 4.2.  Pending report of QuantiFERON -Lumbar puncture was done.  Fluid analysis normal.  Gram stain did not show any organisms.  Sent for AFB culture and CSF culture.  -Started treatment 10 mg prednisone p.o. 3 times daily for 6 weeks followed by slow taper. -Neurosurgery to  decide if patient requires surgical excision, inpatient versus outpatient.  Mobility: Encourage ambulation Code Status:  Full code  DVT prophylaxis:  SCDs Antimicrobials:  None Fluid: None Diet: Regular diet  Consultants: Neurology, neurosurgery Family Communication:  Wife at bedside  Status is: Inpatient  Remains inpatient appropriate because:Inpatient level of care appropriate due to severity of illness   Dispo: The patient is from: Home              Anticipated d/c is to: Home              Anticipated d/c date is: 2 days              Patient currently is not medically stable to d/c.  Antimicrobials: Anti-infectives (From admission, onward)   None        Code Status: Full Code   Diet Order            Diet regular Room service appropriate? Yes; Fluid consistency: Thin  Diet effective ____              Infusions:    Scheduled Meds: . predniSONE  10 mg Oral TID  . tuberculin  5 Units Intradermal Once    PRN meds: acetaminophen **OR** acetaminophen, morphine injection, ondansetron **OR** ondansetron (ZOFRAN) IV, oxyCODONE   Objective: Vitals:   11/06/19 0348 11/06/19 0751  BP: 129/81 112/85  Pulse: 70 68  Resp: 16 16  Temp: 98.9 F (37.2 C) 98.8 F (37.1 C)  SpO2: 99% 100%    Intake/Output Summary (Last 24 hours) at 11/06/2019 0849 Last data filed at 11/05/2019 1700 Gross per 24 hour  Intake 100 ml  Output --  Net 100 ml   Filed Weights   11/05/19 1503  Weight: 86.9 kg   Weight change:  Body mass index is 26.72 kg/m.   Physical Exam: General exam: Appears calm and comfortable.  Skin: No rashes, lesions or ulcers. HEENT: Atraumatic, normocephalic, supple neck, no obvious bleeding.  Right eye ptosis noted Lungs: Clear to auscultate bilaterally CVS: Regular rate and rhythm, no murmur GI/Abd soft, nontender, nondistended, bowel sound present CNS: Alert, awake, oriented x3 Psychiatry: Mood appropriate Extremities: No pedal edema, no calf  tenderness  Data Review: I have personally reviewed the laboratory data and studies available.  Recent Labs  Lab 11/05/19 0141 11/06/19 0314  WBC 5.7 5.3  NEUTROABS 2.4  --   HGB 15.6 16.0  HCT 45.1 46.8  MCV 92.0 92.5  PLT 189 215   Recent Labs  Lab 11/05/19 0141 11/06/19 0314  NA 136 136  K 3.9 4.7  CL 97* 102  CO2 26 25  GLUCOSE 107* 103*  BUN 11 15  CREATININE 1.04 1.22  CALCIUM 9.5 9.5    Signed, Terrilee Croak, MD [facility] Pager: [phone] (Secure Chat preferred). 11/06/2019

## 2019-11-07 LAB — QUANTIFERON-TB GOLD PLUS (RQFGPL)
QuantiFERON Mitogen Value: >10 IU/mL
QuantiFERON Nil Value: 0 IU/mL
QuantiFERON TB1 Ag Value: 0 IU/mL
QuantiFERON TB2 Ag Value: 0 IU/mL

## 2019-11-07 LAB — PROLACTIN: Prolactin: 35.3 ng/mL — ABNORMAL HIGH (ref 4.0–15.2)

## 2019-11-07 LAB — QUANTIFERON-TB GOLD PLUS: QuantiFERON-TB Gold Plus: NEGATIVE

## 2019-11-07 MED ORDER — PANTOPRAZOLE SODIUM 40 MG PO TBEC
40.0000 mg | DELAYED_RELEASE_TABLET | Freq: Every day | ORAL | 0 refills | Status: DC
Start: 2019-11-07 — End: 2020-04-16

## 2019-11-07 MED ORDER — PREDNISONE 10 MG PO TABS: ORAL_TABLET | ORAL | 0 refills | Status: DC

## 2019-11-07 NOTE — Progress Notes (Signed)
PROGRESS NOTE  Frederick Mendez  DOB: Oct 26, 1980  PCP: Seward Carol, MD DXA:[id number]  DOA: 11/04/2019  LOS: 2 days   Chief Complaint  Patient presents with  . Eye Problem   Brief narrative: Frederick Mendez is a 39 y.o. male with no significant past medical history who presented to the ED on 5/20 with complaint of 1 week history of progressive binocular double vision, right ptosis and retroorbital pain.   Imagings were done in the ED.  MRI brain and orbits with and without contrast: 1. 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 The mass contacts the optic chiasm. No visible cavernous sinus invasion. 2. Normal appearance of the brain itself.  MRV head and CTA head normal.  Patient was seen by neurology and neurosurgery. Admitted to hospitalist service for further evaluation management.  Subjective: Patient was seen and examined this morning. Pleasant young African-American male.  Not in distress. Right eye ptosis seems to be improving.    Assessment/Plan: Pituitary adenoma -Presented with right eye ptosis, double vision and retro-orbital pain.   -Imaging findings as above. -Neurology and neurosurgery consult appreciated. -Per recommendations, need to rule out granulomatous hypophysitis secondary to systemic disease that can form granulomata including sarcoidosis, syphilis and TB.  Tests sent per neurology: RPR, tuberculin skin test, ACE level, chest x-ray, ESR, CRP, TSH, pituitary hormone panel, morning cortisol level. -ESR, CRP, ACE level, TSH level, chest x-ray normal.  HIV negative.   -Prolactin level elevated.  Plasma cortisol level seems low at 4.2.   -Pending report of QuantiFERON -Lumbar puncture was done.  Fluid analysis normal.  Gram stain did not show any organisms.  Sent for AFB culture and CSF culture.  -Started treatment 10 mg prednisone p.o. 3 times daily for 6 weeks followed by slow taper. -Right eye ptosis seems to be improving  today. -Neurosurgery to decide if patient requires surgical excision, inpatient versus outpatient.  Mobility: Encourage ambulation Code Status:  Full code  DVT prophylaxis:  SCDs Antimicrobials:  None Fluid: None Diet: Regular diet  Consultants: Neurology, neurosurgery Family Communication:  Wife at bedside  Status is: Inpatient  Remains inpatient appropriate because:Inpatient level of care appropriate due to severity of illness  Dispo: The patient is from: Home              Anticipated d/c is to: Home              Anticipated d/c date is: 2 days              Patient currently is not medically stable to d/c.  Antimicrobials: Anti-infectives (From admission, onward)   None        Code Status: Full Code   Diet Order            Diet regular Room service appropriate? Yes; Fluid consistency: Thin  Diet effective ____              Infusions:    Scheduled Meds: . predniSONE  10 mg Oral TID  . tuberculin  5 Units Intradermal Once    PRN meds: acetaminophen **OR** acetaminophen, morphine injection, ondansetron **OR** ondansetron (ZOFRAN) IV, oxyCODONE   Objective: Vitals:   11/07/19 0410 11/07/19 0739  BP: 119/86 (!) 121/95  Pulse: 74 74  Resp: 16 14  Temp: 98.2 F (36.8 C) 98 F (36.7 C)  SpO2: 98% 100%    Intake/Output Summary (Last 24 hours) at 11/07/2019 1114 Last data filed at 11/06/2019 1700 Gross  per 24 hour  Intake 240 ml  Output --  Net 240 ml   Filed Weights   11/05/19 1503  Weight: 86.9 kg   Weight change:  Body mass index is 26.72 kg/m.   Physical Exam: General exam: Appears calm and comfortable.  Skin: No rashes, lesions or ulcers. HEENT: Atraumatic, normocephalic, supple neck, no obvious bleeding.  Right eye ptosis noted Lungs: Clear to auscultate bilaterally CVS: Regular rate and rhythm, no murmur GI/Abd soft, nontender, nondistended, bowel sound present CNS: Alert, awake, oriented x3 Psychiatry: Mood appropriate Extremities:  No pedal edema, no calf tenderness  Data Review: I have personally reviewed the laboratory data and studies available.  Recent Labs  Lab 11/05/19 0141 11/06/19 0314  WBC 5.7 5.3  NEUTROABS 2.4  --   HGB 15.6 16.0  HCT 45.1 46.8  MCV 92.0 92.5  PLT 189 215   Recent Labs  Lab 11/05/19 0141 11/06/19 0314  NA 136 136  K 3.9 4.7  CL 97* 102  CO2 26 25  GLUCOSE 107* 103*  BUN 11 15  CREATININE 1.04 1.22  CALCIUM 9.5 9.5    Signed, Terrilee Croak, MD [facility] Pager: [phone] (Secure Chat preferred). 11/07/2019

## 2019-11-07 NOTE — Progress Notes (Signed)
Patient ID: Frederick Mendez, male   DOB: 30-Apr-1981, 39 y.o.   MRN: [id number] BP 127/76 (BP Location: Left Arm)   Pulse 76   Temp 98.2 F (36.8 C) (Oral)   Resp 15   Ht 5\' 11"  (1.803 m)   Wt 86.9 kg   SpO2 100%   BMI 26.72 kg/m  Alert and oriented x 4 Speech is clear and fluent Right eye with significant improvement. Diplopia has decreased, eye is less ptotic.  May be discharged, can follow up this week with Dr. Zada Finders in the office.  No drift, 5/5 strength Tongue and uvula midline Symmetric facies, symmetric facial movements Prolactin is normal.

## 2019-11-07 NOTE — Discharge Summary (Signed)
Physician Discharge Summary  Barrie Wale NAT:[id number] DOB: [birth year] DOA: 11/04/2019  PCP: Seward Carol, MD  Admit date: 11/04/2019 Discharge date: 11/07/2019  Admitted From: Home Discharge disposition: Home   Code Status: Full Code  Diet Recommendation: General diet   Recommendations for Outpatient Follow-Up:   1. Follow-up with neurosurgery as an outpatient  Discharge Diagnosis:   Principal Problem:   Pituitary mass ([place])    History of Present Illness / Brief narrative:  Verlin Uher is a 39 y.o. male with no significant past medical history who presented to the ED on 5/20 with complaint of 1 week history of progressive binocular double vision, right ptosis and retroorbital pain.   Imagings were done in the ED.  MRI brain and orbits with and without contrast: 1. 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 The mass contacts the optic chiasm. No visible cavernous sinus invasion. 2. Normal appearance of the brain itself.  MRV head and CTA head normal  Patient was seen by neurology and neurosurgery. Admitted to hospitalist service for further evaluation management.  Hospital Course:  Pituitary adenoma -Presented with right eye ptosis, double vision and retro-orbital pain.   -Imaging findings as above. -Neurology and neurosurgery consult appreciated. -RPR nonreactive.  ESR, CRP, ACE level, TSH level normal.  Chest x-ray normal.  HIV negative.    Prolactin 35, normal per neurosurgery. -Plasma cortisol level seems slightly low at 4.2.   -Pending report of QuantiFERON -Lumbar puncture was done.  Fluid analysis normal.  Gram stain did not show any organisms.  Sent for AFB culture and CSF culture.  -Currently he is being treated with prednisone 10 mg p.o. 3 times daily.   -Right eye ptosis seems better today. -I discussed the case with Dr. Rory Percy, neurology and Dr. Christella Noa, neurosurgery. -Per neurology, patient's pituitary macroadenoma is  not directly pressing on the current nerve but it is probably causing significant surrounding inflammation which in turn is affecting the third nerve.  The recommended treatment at this time is continuation of 10 mg prednisone p.o. 3 times daily for 6 weeks followed by slow taper.  I wrote the prescription for prednisone 10 mg 3 times daily for 6 weeks followed by 10 mg twice daily for 1 week followed by 10 mg daily for 1 week then stop. -Okay to discharge the patient to follow-up with neurology and neurosurgery as an outpatient.  Subjective:  Seen and examined this morning.  Much improved right eye ptosis.  Discharge Exam:   Vitals:   11/07/19 0009 11/07/19 0410 11/07/19 0739 11/07/19 1258  BP: 132/87 119/86 (!) 121/95 127/76  Pulse: 76 74 74 76  Resp: 18 16 14 15   Temp: 98.8 F (37.1 C) 98.2 F (36.8 C) 98 F (36.7 C) 98.2 F (36.8 C)  TempSrc: Oral Oral Oral Oral  SpO2: 99% 98% 100% 100%  Weight:      Height:        Body mass index is 26.72 kg/m.  General exam: Appears calm and comfortable.  Skin: No rashes, lesions or ulcers. HEENT: Atraumatic, normocephalic, supple neck, no obvious bleeding Lungs: Clear to auscultation bilaterally CVS: Regular rate and rhythm, no murmur GI/Abd soft, nontender, nondistended, bowel sound present CNS: Alert, awake, oriented x3 Psychiatry: Mood appropriate Extremities: No pedal edema, no calf tenderness  Discharge Instructions:  Wound care: None Discharge Instructions    Diet general   Complete by: As directed    Increase activity slowly   Complete  by: As directed      Follow-up Information    Ostergard, Joyice Faster, MD Follow up.   Specialty: Neurosurgery Why: please call when discharged to make an appointment to see Dr. Darene Lamer information: [place] [address] [phone]        [facility] Follow up.   Contact information: [address]     [address] [place]  [place] [address] [phone]         Allergies as of 11/07/2019   No Known Allergies     Medication List    TAKE these medications   ibuprofen 200 MG tablet Commonly known as: ADVIL Take 400 mg by mouth 2 (two) times daily as needed for headache.   pantoprazole 40 MG tablet Commonly known as: Protonix Take 1 tablet (40 mg total) by mouth daily.   predniSONE 10 MG tablet Commonly known as: DELTASONE prednisone 10 mg 3 times daily for 6 weeks followed by 10 mg twice daily for 1 week followed by 10 mg daily for 1 week then stop       Time coordinating discharge: 35 minutes  The results of significant diagnostics from this hospitalization (including imaging, microbiology, ancillary and laboratory) are listed below for reference.    Procedures and Diagnostic Studies:   CT ANGIO HEAD W OR WO CONTRAST  Result Date: 11/05/2019 CLINICAL DATA:  39 year old male with pituitary mass compatible with macroadenoma on MRI earlier today. Diplopia. Query dissection. EXAM: CT ANGIOGRAPHY HEAD AND NECK TECHNIQUE: Multidetector CT imaging of the head and neck was performed using the standard protocol during bolus administration of intravenous contrast. Multiplanar CT image reconstructions and MIPs were obtained to evaluate the vascular anatomy. Carotid stenosis measurements (when applicable) are obtained utilizing NASCET criteria, using the distal internal carotid diameter as the denominator. CONTRAST:  76m OMNIPAQUE IOHEXOL 350 MG/ML SOLN COMPARISON:  Head and orbit MRI plus intracranial MRV earlier today. FINDINGS: CT HEAD Brain: Hyperdense intra sellar mass with suprasellar extension corresponding to the MRI finding (series 8, image 10). No other intracranial mass effect, no midline shift. No ventriculomegaly. No acute intracranial hemorrhage identified. No cortically based acute infarct identified. Gray-white matter differentiation is within normal limits throughout the brain. Incidental  dural calcifications. Calvarium and skull base: No acute osseous abnormality identified. Paranasal sinuses: Visualized paranasal sinuses and mastoids are stable and well pneumatized. Hyperplastic paranasal sinuses again noted. Orbits: Visualized orbits and scalp soft tissues are within normal limits. CTA NECK Skeleton: No osseous abnormality identified. Upper chest: Negative. Other neck: Negative. Aortic arch: 3 vessel arch configuration. No arch atherosclerosis. Right carotid system: Negative. Left carotid system: Negative. Vertebral arteries: Normal proximal subclavian arteries and vertebral artery origins. Both vertebral arteries appear patent and normal to the skull base, the right is slightly dominant. CTA HEAD Posterior circulation: Patent distal vertebral arteries without plaque or stenosis. The right is slightly dominant. Patent PICA origins. Patent basilar artery, AICA, SCA, and PCA origins. Small posterior communicating arteries are present. Bilateral PCA branches are within normal limits. Anterior circulation: Both ICA siphons are patent with no plaque or stenosis. Normal ophthalmic and posterior communicating artery origins. Normal carotid termini. Normal MCA and ACA origins. The left A1 is dominant. Anterior communicating artery and bilateral ACA branches are within normal limits. Left MCA M1 segment and trifurcation are patent without stenosis. Left MCA branches are within normal limits. Right MCA M1 bifurcates early without stenosis. Right MCA branches are within normal limits. Venous sinuses: Patent. Incidental  dural calcifications. Anatomic variants: Mildly dominant right vertebral artery. Dominant left ACA A1. Review of the MIP images confirms the above findings IMPRESSION: 1. Normal arterial findings on CTA Head and Neck. No dissection, atherosclerosis, or aneurysm. 2. Stable pituitary mass with suprasellar extension corresponding to today's MRI finding. 3. No new abnormality identified.  Electronically Signed   By: Genevie Ann M.D.   On: 11/05/2019 23:08   MR Brain W and Wo Contrast  Result Date: 11/05/2019 CLINICAL DATA:  Light sensitivity in the right eye. Being treated for sinus infection. Double vision and right-sided headache. EXAM: MRI HEAD AND ORBITS WITHOUT AND WITH CONTRAST TECHNIQUE: Multiplanar, multiecho pulse sequences of the brain and surrounding structures were obtained without and with intravenous contrast. Multiplanar, multiecho pulse sequences of the orbits and surrounding structures were obtained including fat saturation techniques, before and after intravenous contrast administration. CONTRAST:  36m GADAVIST GADOBUTROL 1 MMOL/ML IV SOLN COMPARISON:  None. FINDINGS: MRI HEAD FINDINGS Brain: 18 x 16 x 13 mm sellar and suprasellar mass that is indistinguishable from normal pituitary gland. The mass is enhancing and has a wasted appearance. The mass contacts the optic chiasm in the suprasellar cistern. No visible cavernous sinus invasion to correlate with double vision. No notable heterogeneity to suggest recent hemorrhage. No acute infarction, hemorrhage, hydrocephalus, extra-axial collection or white matter disease. Vascular: These normal flow voids and vascular enhancement Skull and upper cervical spine: Normal marrow signal MRI ORBITS FINDINGS Orbits:  No evidence of mass, inflammation, or venous occlusion. Visualized sinuses: Opacified right posterior ethmoid air cell and partial opacification of the left frontal sinus. Soft tissues: Negative Limited intracranial: Negative IMPRESSION: 1. 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 The mass contacts the optic chiasm. No visible cavernous sinus invasion. 2. Normal appearance of the brain itself. Electronically Signed   By: JMonte FantasiaM.D.   On: 11/05/2019 05:46   DG CHEST PORT 1 VIEW  Result Date: 11/05/2019 CLINICAL DATA:  Progressive headache and diplopia. Reported pituitary mass.  Preoperative assessment. EXAM: PORTABLE CHEST 1 VIEW COMPARISON:  None. FINDINGS: The lungs are clear. Heart is borderline enlarged with pulmonary vascularity normal. No adenopathy. No bone lesions. IMPRESSION: Borderline cardiac prominence.  Lungs clear.  No adenopathy evident. Electronically Signed   By: WLowella GripIII M.D.   On: 11/05/2019 10:15   MR MRV HEAD W WO CONTRAST  Result Date: 11/05/2019 CLINICAL DATA:  Double vision and headache. Being treated for sinus infection. EXAM: MR VENOGRAM HEAD WITHOUT AND WITH CONTRAST TECHNIQUE: Angiographic images of the intracranial venous structures were obtained using MRV technique without and with intravenous contrast. CONTRAST:  953mGADAVIST GADOBUTROL 1 MMOL/ML IV SOLN COMPARISON:  None. FINDINGS: Normal enhancement of the dural venous sinuses and major deep veins. There is symmetric cavernous sinus enhancement and patent superior ophthalmic veins in this patient with sellar mass. IMPRESSION: Negative MRV. Electronically Signed   By: JoMonte Fantasia.D.   On: 11/05/2019 05:48   CT ANGIO NECK CODE STROKE  Result Date: 11/05/2019 CLINICAL DATA:  [age]ear old male with pituitary mass compatible with macroadenoma on MRI earlier today. Diplopia. Query dissection. EXAM: CT ANGIOGRAPHY HEAD AND NECK TECHNIQUE: Multidetector CT imaging of the head and neck was performed using the standard protocol during bolus administration of intravenous contrast. Multiplanar CT image reconstructions and MIPs were obtained to evaluate the vascular anatomy. Carotid stenosis measurements (when applicable) are obtained utilizing NASCET criteria, using the distal internal carotid diameter as the  denominator. CONTRAST:  57m OMNIPAQUE IOHEXOL 350 MG/ML SOLN COMPARISON:  Head and orbit MRI plus intracranial MRV earlier today. FINDINGS: CT HEAD Brain: Hyperdense intra sellar mass with suprasellar extension corresponding to the MRI finding (series 8, image 10). No other intracranial  mass effect, no midline shift. No ventriculomegaly. No acute intracranial hemorrhage identified. No cortically based acute infarct identified. Gray-white matter differentiation is within normal limits throughout the brain. Incidental dural calcifications. Calvarium and skull base: No acute osseous abnormality identified. Paranasal sinuses: Visualized paranasal sinuses and mastoids are stable and well pneumatized. Hyperplastic paranasal sinuses again noted. Orbits: Visualized orbits and scalp soft tissues are within normal limits. CTA NECK Skeleton: No osseous abnormality identified. Upper chest: Negative. Other neck: Negative. Aortic arch: 3 vessel arch configuration. No arch atherosclerosis. Right carotid system: Negative. Left carotid system: Negative. Vertebral arteries: Normal proximal subclavian arteries and vertebral artery origins. Both vertebral arteries appear patent and normal to the skull base, the right is slightly dominant. CTA HEAD Posterior circulation: Patent distal vertebral arteries without plaque or stenosis. The right is slightly dominant. Patent PICA origins. Patent basilar artery, AICA, SCA, and PCA origins. Small posterior communicating arteries are present. Bilateral PCA branches are within normal limits. Anterior circulation: Both ICA siphons are patent with no plaque or stenosis. Normal ophthalmic and posterior communicating artery origins. Normal carotid termini. Normal MCA and ACA origins. The left A1 is dominant. Anterior communicating artery and bilateral ACA branches are within normal limits. Left MCA M1 segment and trifurcation are patent without stenosis. Left MCA branches are within normal limits. Right MCA M1 bifurcates early without stenosis. Right MCA branches are within normal limits. Venous sinuses: Patent. Incidental dural calcifications. Anatomic variants: Mildly dominant right vertebral artery. Dominant left ACA A1. Review of the MIP images confirms the above findings  IMPRESSION: 1. Normal arterial findings on CTA Head and Neck. No dissection, atherosclerosis, or aneurysm. 2. Stable pituitary mass with suprasellar extension corresponding to today's MRI finding. 3. No new abnormality identified. Electronically Signed   By: HGenevie AnnM.D.   On: 11/05/2019 23:08   MR ORBITS W WO CONTRAST  Result Date: 11/05/2019 CLINICAL DATA:  Light sensitivity in the right eye. Being treated for sinus infection. Double vision and right-sided headache. EXAM: MRI HEAD AND ORBITS WITHOUT AND WITH CONTRAST TECHNIQUE: Multiplanar, multiecho pulse sequences of the brain and surrounding structures were obtained without and with intravenous contrast. Multiplanar, multiecho pulse sequences of the orbits and surrounding structures were obtained including fat saturation techniques, before and after intravenous contrast administration. CONTRAST:  940mGADAVIST GADOBUTROL 1 MMOL/ML IV SOLN COMPARISON:  None. FINDINGS: MRI HEAD FINDINGS Brain: 18 x 16 x 13 mm sellar and suprasellar mass that is indistinguishable from normal pituitary gland. The mass is enhancing and has a wasted appearance. The mass contacts the optic chiasm in the suprasellar cistern. No visible cavernous sinus invasion to correlate with double vision. No notable heterogeneity to suggest recent hemorrhage. No acute infarction, hemorrhage, hydrocephalus, extra-axial collection or white matter disease. Vascular: These normal flow voids and vascular enhancement Skull and upper cervical spine: Normal marrow signal MRI ORBITS FINDINGS Orbits:  No evidence of mass, inflammation, or venous occlusion. Visualized sinuses: Opacified right posterior ethmoid air cell and partial opacification of the left frontal sinus. Soft tissues: Negative Limited intracranial: Negative IMPRESSION: 1. 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 The mass contacts the optic chiasm. No visible cavernous sinus invasion. 2. Normal appearance  of the  brain itself. Electronically Signed   By: Monte Fantasia M.D.   On: 11/05/2019 05:46     Labs:   Basic Metabolic Panel: Recent Labs  Lab 11/05/19 0141 11/06/19 0314  NA 136 136  K 3.9 4.7  CL 97* 102  CO2 26 25  GLUCOSE 107* 103*  BUN 11 15  CREATININE 1.04 1.22  CALCIUM 9.5 9.5   GFR Estimated Creatinine Clearance: 87.4 mL/min (by C-G formula based on SCr of 1.22 mg/dL). Liver Function Tests: No results for input(s): AST, ALT, ALKPHOS, BILITOT, PROT, ALBUMIN in the last 168 hours. No results for input(s): LIPASE, AMYLASE in the last 168 hours. No results for input(s): AMMONIA in the last 168 hours. Coagulation profile No results for input(s): INR, PROTIME in the last 168 hours.  CBC: Recent Labs  Lab 11/05/19 0141 11/06/19 0314  WBC 5.7 5.3  NEUTROABS 2.4  --   HGB 15.6 16.0  HCT 45.1 46.8  MCV 92.0 92.5  PLT 189 215   Cardiac Enzymes: No results for input(s): CKTOTAL, CKMB, CKMBINDEX, TROPONINI in the last 168 hours. BNP: Invalid input(s): POCBNP CBG: No results for input(s): GLUCAP in the last 168 hours. D-Dimer No results for input(s): DDIMER in the last 72 hours. Hgb A1c No results for input(s): HGBA1C in the last 72 hours. Lipid Profile No results for input(s): CHOL, HDL, LDLCALC, TRIG, CHOLHDL, LDLDIRECT in the last 72 hours. Thyroid function studies Recent Labs    11/05/19 1023  TSH 2.221   Anemia work up No results for input(s): VITAMINB12, FOLATE, FERRITIN, TIBC, IRON, RETICCTPCT in the last 72 hours. Microbiology Recent Results (from the past 240 hour(s))  SARS Coronavirus 2 by RT PCR (hospital order, performed in [facility] hospital lab) Nasopharyngeal Nasopharyngeal Swab     Status: None   Collection Time: 11/05/19  6:29 AM   Specimen: Nasopharyngeal Swab  Result Value Ref Range Status   SARS Coronavirus 2 NEGATIVE NEGATIVE Final    Comment: (NOTE) SARS-CoV-2 target nucleic acids are NOT DETECTED. The SARS-CoV-2 RNA is  generally detectable in upper and lower respiratory specimens during the acute phase of infection. The lowest concentration of SARS-CoV-2 viral copies this assay can detect is 250 copies / mL. A negative result does not preclude SARS-CoV-2 infection and should not be used as the sole basis for treatment or other patient management decisions.  A negative result may occur with improper specimen collection / handling, submission of specimen other than nasopharyngeal swab, presence of viral mutation(s) within the areas targeted by this assay, and inadequate number of viral copies (<250 copies / mL). A negative result must be combined with clinical observations, patient history, and epidemiological information. Fact Sheet for Patients:   StrictlyIdeas.no Fact Sheet for Healthcare Providers: BankingDealers.co.za This test is not yet approved or cleared  by the [place] FDA and has been authorized for detection and/or diagnosis of SARS-CoV-2 by FDA under an Emergency Use Authorization (EUA).  This EUA will remain in effect (meaning this test can be used) for the duration of the COVID-19 declaration under Section 564(b)(1) of the Act, 21 U.S.C. section 360bbb-3(b)(1), unless the authorization is terminated or revoked sooner. Performed at [facility], [place] [address] [place],  [address]   CSF culture with Stat gram stain     Status: None (Preliminary result)   Collection Time: 11/05/19  9:10 AM   Specimen: CSF; Cerebrospinal Fluid  Result Value Ref Range Status   Specimen Description CSF  Final  Special Requests Normal  Final   Gram Stain NO WBC SEEN NO ORGANISMS SEEN CYTOSPIN SMEAR   Final   Culture   Final    NO GROWTH 2 DAYS Performed at [facility], [address], [place],  [address]    Report Status PENDING  Incomplete    Please note: You were cared for by a hospitalist during your hospital stay.  Once you are discharged, your primary care physician will handle any further medical issues. Please note that NO REFILLS for any discharge medications will be authorized once you are discharged, as it is imperative that you return to your primary care physician (or establish a relationship with a primary care physician if you do not have one) for your post hospital discharge needs so that they can reassess your need for medications and monitor your lab values.  Signed: Terrilee Croak  [facility] 11/07/2019, 2:12 PM

## 2019-11-07 NOTE — Progress Notes (Signed)
Pt discharge education and instructions completed with pt and spouse at bedside, both voices understanding and denies any questions. Pt IV removed and pt to pick up electronically sent prescriptions from preferred pharmacy on file. Pt TB skin PPD read to be 27mm and no induration. Pt offered wheelchair but he declines and declines to ambulate off unit spouse and belongings to the side. Pt discharged home with spouse to transport him home. Delia Heady RN

## 2019-11-08 LAB — CSF CULTURE W GRAM STAIN
Culture: NO GROWTH
Gram Stain: NONE SEEN
Special Requests: NORMAL

## 2019-11-08 LAB — VDRL, CSF: VDRL Quant, CSF: NONREACTIVE

## 2019-11-13 LAB — ACID FAST SMEAR (AFB, MYCOBACTERIA): Acid Fast Smear: NEGATIVE

## 2019-12-25 LAB — ACID FAST CULTURE WITH REFLEXED SENSITIVITIES (MYCOBACTERIA): Acid Fast Culture: NEGATIVE

## 2020-01-03 ENCOUNTER — Other Ambulatory Visit (HOSPITAL_COMMUNITY): Payer: Self-pay | Admitting: Neurological Surgery

## 2020-01-03 ENCOUNTER — Other Ambulatory Visit: Payer: Self-pay | Admitting: Neurological Surgery

## 2020-01-03 DIAGNOSIS — D352 Benign neoplasm of pituitary gland: Secondary | ICD-10-CM

## 2020-01-15 ENCOUNTER — Ambulatory Visit (HOSPITAL_COMMUNITY)
Admission: RE | Admit: 2020-01-15 | Discharge: 2020-01-15 | Disposition: A | Discharge status: Home / Self Care | Payer: No Typology Code available for payment source | Source: Ambulatory Visit | Attending: Neurological Surgery | Admitting: Neurological Surgery

## 2020-01-15 ENCOUNTER — Other Ambulatory Visit: Payer: Self-pay

## 2020-01-15 DIAGNOSIS — D352 Benign neoplasm of pituitary gland: Secondary | ICD-10-CM | POA: Diagnosis present

## 2020-01-15 MED ORDER — GADOBUTROL 1 MMOL/ML IV SOLN
8.5000 mL | Freq: Once | INTRAVENOUS | Status: AC | PRN
  Administered 2020-01-15: 8.5 mL via INTRAVENOUS

## 2020-03-23 ENCOUNTER — Other Ambulatory Visit: Payer: Self-pay | Admitting: Otolaryngology

## 2020-03-23 ENCOUNTER — Other Ambulatory Visit: Payer: Self-pay | Admitting: Neurological Surgery

## 2020-03-30 ENCOUNTER — Other Ambulatory Visit: Payer: Self-pay | Admitting: Otolaryngology

## 2020-04-07 NOTE — Progress Notes (Addendum)
[place], [place] [address] Phone: [phone] Fax: [phone]      Your procedure is scheduled on Friday, October 29th.  Report to [facility] Main Entrance "A" at 5:30 A.M., and check in at the Admitting office.  Call this number if you have problems the morning of surgery:  [phone]  Call [phone] if you have any questions prior to your surgery date Monday-Friday 8am-4pm    Remember:  Do not eat or drink after midnight the night before your surgery     Take these medicines the morning of surgery with A SIP OF WATER   NONE  As of today, STOP taking any Aspirin (unless otherwise instructed by your surgeon) Aleve, Naproxen, Ibuprofen, Motrin, Advil, Goody's, BC's, all herbal medications, fish oil, and all vitamins.                      Do not wear jewelry            Do not wear lotions, powders, colognes, or deodorant.            Men may shave face and neck.            Do not bring valuables to the hospital.            [facility] is not responsible for any belongings or valuables.  Do NOT Smoke (Tobacco/Vaping) or drink Alcohol 24 hours prior to your procedure If you use a CPAP at night, you may bring all equipment for your overnight stay.   Contacts, glasses, dentures or bridgework may not be worn into surgery.      For patients admitted to the hospital, discharge time will be determined by your treatment team.   Patients discharged the day of surgery will not be allowed to drive home, and someone needs to stay with them for 24 hours.    Special instructions:   [facility]- Preparing For Surgery  Before surgery, you can play an important role. Because skin is not sterile, your skin needs to be as free of germs as possible. You can reduce the number of germs on your skin by washing with CHG (chlorahexidine gluconate) Soap before surgery.  CHG is an antiseptic cleaner  which kills germs and bonds with the skin to continue killing germs even after washing.    Oral Hygiene is also important to reduce your risk of infection.  Remember - BRUSH YOUR TEETH THE MORNING OF SURGERY WITH YOUR REGULAR TOOTHPASTE  Please do not use if you have an allergy to CHG or antibacterial soaps. If your skin becomes reddened/irritated stop using the CHG.  Do not shave (including legs and underarms) for at least 48 hours prior to first CHG shower. It is OK to shave your face.  Please follow these instructions carefully.   1. Shower the NIGHT BEFORE SURGERY and the MORNING OF SURGERY with CHG Soap.   2. If you chose to wash your hair, wash your hair first as usual with your normal shampoo.  3. After you shampoo, rinse your hair and body thoroughly to remove the shampoo.  4. Use CHG as you would any other liquid soap. You can apply CHG directly to the skin and wash gently with a scrungie or a clean washcloth.   5. Apply the CHG Soap to your body ONLY FROM THE NECK DOWN.  Do not use on open wounds or  open sores. Avoid contact with your eyes, ears, mouth and genitals (private parts). Wash Face and genitals (private parts)  with your normal soap.   6. Wash thoroughly, paying special attention to the area where your surgery will be performed.  7. Thoroughly rinse your body with warm water from the neck down.  8. DO NOT shower/wash with your normal soap after using and rinsing off the CHG Soap.  9. Pat yourself dry with a CLEAN TOWEL.  10. Wear CLEAN PAJAMAS to bed the night before surgery  11. Place CLEAN SHEETS on your bed the night of your first shower and DO NOT SLEEP WITH PETS.   Day of Surgery: Wear Clean/Comfortable clothing the morning of surgery Do not apply any deodorants/lotions.   Remember to brush your teeth WITH YOUR REGULAR TOOTHPASTE.   Please read over the following fact sheets that you were given.

## 2020-04-10 ENCOUNTER — Other Ambulatory Visit: Payer: Self-pay

## 2020-04-10 ENCOUNTER — Encounter (HOSPITAL_COMMUNITY): Payer: Self-pay

## 2020-04-10 ENCOUNTER — Encounter (HOSPITAL_COMMUNITY)
Admission: RE | Admit: 2020-04-10 | Discharge: 2020-04-10 | Disposition: A | Discharge status: Home / Self Care | Payer: No Typology Code available for payment source | Source: Ambulatory Visit | Attending: Neurological Surgery | Admitting: Neurological Surgery

## 2020-04-10 DIAGNOSIS — Z01812 Encounter for preprocedural laboratory examination: Secondary | ICD-10-CM | POA: Insufficient documentation

## 2020-04-10 LAB — CBC
HCT: 44.6 % (ref 39.0–52.0)
Hemoglobin: 14.8 g/dL (ref 13.0–17.0)
MCH: 31.7 pg (ref 26.0–34.0)
MCHC: 33.2 g/dL (ref 30.0–36.0)
MCV: 95.5 fL (ref 80.0–100.0)
Platelets: 194 10*3/uL (ref 150–400)
RBC: 4.67 MIL/uL (ref 4.22–5.81)
RDW: 12 % (ref 11.5–15.5)
WBC: 3.4 10*3/uL — ABNORMAL LOW (ref 4.0–10.5)
nRBC: 0 % (ref 0.0–0.2)

## 2020-04-10 LAB — TYPE AND SCREEN
ABO/RH(D): O POS
Antibody Screen: NEGATIVE

## 2020-04-10 NOTE — Progress Notes (Signed)
PCP - Seward Carol  Chest x-ray - 11-05-19 (1 view) EKG - 11-08-19  COVID TEST- Wednesday 04-12-20   Anesthesia review: n/a  Patient denies shortness of breath, fever, cough and chest pain at PAT appointment   All instructions explained to the patient, with a verbal understanding of the material. Patient agrees to go over the instructions while at home for a better understanding. Patient also instructed to self quarantine after being tested for COVID-19. The opportunity to ask questions was provided.

## 2020-04-12 ENCOUNTER — Other Ambulatory Visit (HOSPITAL_COMMUNITY)
Admission: RE | Admit: 2020-04-12 | Discharge: 2020-04-12 | Disposition: A | Discharge status: Home / Self Care | Payer: No Typology Code available for payment source | Source: Ambulatory Visit | Attending: Neurological Surgery | Admitting: Neurological Surgery

## 2020-04-12 DIAGNOSIS — Z20822 Contact with and (suspected) exposure to covid-19: Secondary | ICD-10-CM | POA: Insufficient documentation

## 2020-04-12 DIAGNOSIS — Z01818 Encounter for other preprocedural examination: Secondary | ICD-10-CM | POA: Insufficient documentation

## 2020-04-12 LAB — SARS CORONAVIRUS 2 (TAT 6-24 HRS): SARS Coronavirus 2: NEGATIVE

## 2020-04-14 ENCOUNTER — Encounter (HOSPITAL_COMMUNITY)
Admission: RE | Disposition: A | Discharge status: Home / Self Care | Payer: Self-pay | Source: Home / Self Care | Attending: Neurological Surgery

## 2020-04-14 ENCOUNTER — Encounter (HOSPITAL_COMMUNITY): Payer: Self-pay | Admitting: Neurological Surgery

## 2020-04-14 ENCOUNTER — Other Ambulatory Visit: Payer: Self-pay

## 2020-04-14 ENCOUNTER — Inpatient Hospital Stay (HOSPITAL_COMMUNITY): Payer: No Typology Code available for payment source | Admitting: Certified Registered Nurse Anesthetist

## 2020-04-14 ENCOUNTER — Inpatient Hospital Stay (HOSPITAL_COMMUNITY)
Admission: RE | Admit: 2020-04-14 | Discharge: 2020-04-16 | DRG: 615 | Disposition: A | Discharge status: Home / Self Care | Payer: No Typology Code available for payment source | Attending: Neurological Surgery | Admitting: Neurological Surgery

## 2020-04-14 DIAGNOSIS — J341 Cyst and mucocele of nose and nasal sinus: Secondary | ICD-10-CM | POA: Diagnosis present

## 2020-04-14 DIAGNOSIS — D352 Benign neoplasm of pituitary gland: Secondary | ICD-10-CM | POA: Diagnosis present

## 2020-04-14 DIAGNOSIS — D497 Neoplasm of unspecified behavior of endocrine glands and other parts of nervous system: Secondary | ICD-10-CM | POA: Diagnosis present

## 2020-04-14 DIAGNOSIS — Z20822 Contact with and (suspected) exposure to covid-19: Secondary | ICD-10-CM | POA: Diagnosis present

## 2020-04-14 HISTORY — PX: CRANIOTOMY: SHX93

## 2020-04-14 HISTORY — PX: TRANSPHENOIDAL APPROACH EXPOSURE: SHX6311

## 2020-04-14 LAB — CBC
HCT: 43.4 % (ref 39.0–52.0)
Hemoglobin: 14.5 g/dL (ref 13.0–17.0)
MCH: 30.9 pg (ref 26.0–34.0)
MCHC: 33.4 g/dL (ref 30.0–36.0)
MCV: 92.3 fL (ref 80.0–100.0)
Platelets: 179 10*3/uL (ref 150–400)
RBC: 4.7 MIL/uL (ref 4.22–5.81)
RDW: 11.9 % (ref 11.5–15.5)
WBC: 9.8 10*3/uL (ref 4.0–10.5)
nRBC: 0 % (ref 0.0–0.2)

## 2020-04-14 LAB — CREATININE, SERUM
Creatinine, Ser: 0.99 mg/dL (ref 0.61–1.24)
GFR, Estimated: >60 mL/min (ref >60)

## 2020-04-14 LAB — ABO/RH: ABO/RH(D): O POS

## 2020-04-14 SURGERY — CRANIOTOMY HYPOPHYSECTOMY TRANSNASAL APPROACH
Anesthesia: General | Site: Nose

## 2020-04-14 MED ORDER — CHLORHEXIDINE GLUCONATE CLOTH 2 % EX PADS
6.0000 | MEDICATED_PAD | Freq: Every day | CUTANEOUS | Status: DC
  Administered 2020-04-14 – 2020-04-15 (×2): 6 via TOPICAL

## 2020-04-14 MED ORDER — OXYCODONE HCL 5 MG PO TABS: 5.0000 mg | ORAL_TABLET | Freq: Once | ORAL | Status: DC | PRN

## 2020-04-14 MED ORDER — THROMBIN 5000 UNITS EX SOLR
CUTANEOUS | Status: AC
  Filled 2020-04-14: qty 5000

## 2020-04-14 MED ORDER — OXYMETAZOLINE HCL 0.05 % NA SOLN
NASAL | Status: AC
  Filled 2020-04-14: qty 30

## 2020-04-14 MED ORDER — OXYCODONE HCL 5 MG/5ML PO SOLN: 5.0000 mg | Freq: Once | ORAL | Status: DC | PRN

## 2020-04-14 MED ORDER — LACTATED RINGERS IV SOLN: INTRAVENOUS | Status: DC

## 2020-04-14 MED ORDER — ONDANSETRON HCL 4 MG/2ML IJ SOLN
INTRAMUSCULAR | Status: DC | PRN
  Administered 2020-04-14: 4 mg via INTRAVENOUS

## 2020-04-14 MED ORDER — FENTANYL CITRATE (PF) 250 MCG/5ML IJ SOLN
INTRAMUSCULAR | Status: AC
  Filled 2020-04-14: qty 5

## 2020-04-14 MED ORDER — MICROFIBRILLAR COLL HEMOSTAT EX PADS
MEDICATED_PAD | CUTANEOUS | Status: DC | PRN
  Administered 2020-04-14: 1 via TOPICAL

## 2020-04-14 MED ORDER — PROPOFOL 10 MG/ML IV BOLUS
INTRAVENOUS | Status: AC
  Filled 2020-04-14: qty 20

## 2020-04-14 MED ORDER — CEPHALEXIN 500 MG PO CAPS
500.0000 mg | ORAL_CAPSULE | Freq: Three times a day (TID) | ORAL | Status: DC
  Administered 2020-04-15 – 2020-04-16 (×3): 500 mg via ORAL
  Filled 2020-04-14 (×5): qty 1

## 2020-04-14 MED ORDER — ACETAMINOPHEN 325 MG PO TABS: 650.0000 mg | ORAL_TABLET | ORAL | Status: DC | PRN

## 2020-04-14 MED ORDER — LIDOCAINE 2% (20 MG/ML) 5 ML SYRINGE
INTRAMUSCULAR | Status: DC | PRN
  Administered 2020-04-14: 100 mg via INTRAVENOUS

## 2020-04-14 MED ORDER — CEFAZOLIN SODIUM-DEXTROSE 2-4 GM/100ML-% IV SOLN: 2.0000 g | Freq: Three times a day (TID) | INTRAVENOUS | Status: DC

## 2020-04-14 MED ORDER — HYDROMORPHONE HCL 1 MG/ML IJ SOLN: 0.5000 mg | INTRAMUSCULAR | Status: DC | PRN

## 2020-04-14 MED ORDER — MUPIROCIN CALCIUM 2 % EX CREA
TOPICAL_CREAM | CUTANEOUS | Status: AC
  Filled 2020-04-14: qty 15

## 2020-04-14 MED ORDER — ONDANSETRON HCL 4 MG/2ML IJ SOLN
INTRAMUSCULAR | Status: AC
  Filled 2020-04-14: qty 2

## 2020-04-14 MED ORDER — SUGAMMADEX SODIUM 200 MG/2ML IV SOLN
INTRAVENOUS | Status: DC | PRN
  Administered 2020-04-14: 200 mg via INTRAVENOUS

## 2020-04-14 MED ORDER — HYDROCODONE-ACETAMINOPHEN 5-325 MG PO TABS: 1.0000 | ORAL_TABLET | ORAL | Status: DC | PRN

## 2020-04-14 MED ORDER — HEPARIN SODIUM (PORCINE) 5000 UNIT/ML IJ SOLN
5000.0000 [IU] | Freq: Three times a day (TID) | INTRAMUSCULAR | Status: DC
  Administered 2020-04-16: 5000 [IU] via SUBCUTANEOUS
  Filled 2020-04-14: qty 1

## 2020-04-14 MED ORDER — CHLORHEXIDINE GLUCONATE 0.12 % MT SOLN
15.0000 mL | Freq: Once | OROMUCOSAL | Status: AC
  Administered 2020-04-14: 15 mL via OROMUCOSAL
  Filled 2020-04-14: qty 15

## 2020-04-14 MED ORDER — ORAL CARE MOUTH RINSE: 15.0000 mL | Freq: Once | OROMUCOSAL | Status: AC

## 2020-04-14 MED ORDER — ACETAMINOPHEN 650 MG RE SUPP: 650.0000 mg | RECTAL | Status: DC | PRN

## 2020-04-14 MED ORDER — THROMBIN 5000 UNITS EX SOLR
CUTANEOUS | Status: AC
  Filled 2020-04-14: qty 15000

## 2020-04-14 MED ORDER — CEFAZOLIN SODIUM-DEXTROSE 1-4 GM/50ML-% IV SOLN
1.0000 g | Freq: Three times a day (TID) | INTRAVENOUS | Status: AC
  Administered 2020-04-14 – 2020-04-15 (×3): 1 g via INTRAVENOUS
  Filled 2020-04-14 (×3): qty 50

## 2020-04-14 MED ORDER — LIDOCAINE-EPINEPHRINE 1 %-1:100000 IJ SOLN
INTRAMUSCULAR | Status: AC
  Filled 2020-04-14: qty 1

## 2020-04-14 MED ORDER — 0.9 % SODIUM CHLORIDE (POUR BTL) OPTIME
TOPICAL | Status: DC | PRN
  Administered 2020-04-14: 1000 mL

## 2020-04-14 MED ORDER — ROCURONIUM BROMIDE 10 MG/ML (PF) SYRINGE
PREFILLED_SYRINGE | INTRAVENOUS | Status: AC
  Filled 2020-04-14: qty 10

## 2020-04-14 MED ORDER — CEFAZOLIN SODIUM-DEXTROSE 2-4 GM/100ML-% IV SOLN
2.0000 g | INTRAVENOUS | Status: AC
  Administered 2020-04-14: 2 g via INTRAVENOUS
  Filled 2020-04-14: qty 100

## 2020-04-14 MED ORDER — SODIUM CHLORIDE 0.9 % IV SOLN: INTRAVENOUS | Status: DC | PRN

## 2020-04-14 MED ORDER — ONDANSETRON HCL 4 MG/2ML IJ SOLN: 4.0000 mg | INTRAMUSCULAR | Status: DC | PRN

## 2020-04-14 MED ORDER — MIDAZOLAM HCL 2 MG/2ML IJ SOLN
INTRAMUSCULAR | Status: DC | PRN
  Administered 2020-04-14: 2 mg via INTRAVENOUS

## 2020-04-14 MED ORDER — HYDROCORTISONE NA SUCCINATE PF 250 MG IJ SOLR
INTRAMUSCULAR | Status: AC
  Filled 2020-04-14: qty 250

## 2020-04-14 MED ORDER — LIDOCAINE-EPINEPHRINE 1 %-1:100000 IJ SOLN
INTRAMUSCULAR | Status: DC | PRN
  Administered 2020-04-14: 9 mL

## 2020-04-14 MED ORDER — PROPOFOL 10 MG/ML IV BOLUS
INTRAVENOUS | Status: DC | PRN
  Administered 2020-04-14: 200 mg via INTRAVENOUS

## 2020-04-14 MED ORDER — CEPHALEXIN 500 MG PO CAPS: 500.0000 mg | ORAL_CAPSULE | Freq: Three times a day (TID) | ORAL | 0 refills | Status: AC

## 2020-04-14 MED ORDER — MENTHOL 3 MG MT LOZG
1.0000 | LOZENGE | OROMUCOSAL | Status: DC | PRN
  Filled 2020-04-14 (×2): qty 9

## 2020-04-14 MED ORDER — DEXAMETHASONE SODIUM PHOSPHATE 10 MG/ML IJ SOLN
INTRAMUSCULAR | Status: AC
  Filled 2020-04-14: qty 1

## 2020-04-14 MED ORDER — HYDROCORTISONE NA SUCCINATE PF 100 MG IJ SOLR
INTRAMUSCULAR | Status: DC | PRN
  Administered 2020-04-14: 100 mg via INTRAVENOUS

## 2020-04-14 MED ORDER — PROMETHAZINE HCL 25 MG PO TABS: 12.5000 mg | ORAL_TABLET | ORAL | Status: DC | PRN

## 2020-04-14 MED ORDER — MIDAZOLAM HCL 2 MG/2ML IJ SOLN
INTRAMUSCULAR | Status: AC
  Filled 2020-04-14: qty 2

## 2020-04-14 MED ORDER — DEXMEDETOMIDINE (PRECEDEX) IN NS 20 MCG/5ML (4 MCG/ML) IV SYRINGE
PREFILLED_SYRINGE | INTRAVENOUS | Status: DC | PRN
  Administered 2020-04-14 (×2): 12 ug via INTRAVENOUS
  Administered 2020-04-14: 8 ug via INTRAVENOUS

## 2020-04-14 MED ORDER — CHLORHEXIDINE GLUCONATE CLOTH 2 % EX PADS: 6.0000 | MEDICATED_PAD | Freq: Once | CUTANEOUS | Status: DC

## 2020-04-14 MED ORDER — ARTIFICIAL TEARS OPHTHALMIC OINT
TOPICAL_OINTMENT | OPHTHALMIC | Status: DC | PRN
  Administered 2020-04-14: 1 via OPHTHALMIC

## 2020-04-14 MED ORDER — ONDANSETRON HCL 4 MG PO TABS: 4.0000 mg | ORAL_TABLET | ORAL | Status: DC | PRN

## 2020-04-14 MED ORDER — HEMOSTATIC AGENTS (NO CHARGE) OPTIME
TOPICAL | Status: DC | PRN
  Administered 2020-04-14: 1 via TOPICAL

## 2020-04-14 MED ORDER — SALINE SPRAY 0.65 % NA SOLN
4.0000 | NASAL | Status: DC | PRN
  Administered 2020-04-15: 4 via NASAL
  Filled 2020-04-14: qty 44

## 2020-04-14 MED ORDER — DEXAMETHASONE SODIUM PHOSPHATE 10 MG/ML IJ SOLN
INTRAMUSCULAR | Status: DC | PRN
  Administered 2020-04-14: 10 mg via INTRAVENOUS

## 2020-04-14 MED ORDER — FENTANYL CITRATE (PF) 100 MCG/2ML IJ SOLN: 25.0000 ug | INTRAMUSCULAR | Status: DC | PRN

## 2020-04-14 MED ORDER — OXYMETAZOLINE HCL 0.05 % NA SOLN
NASAL | Status: DC | PRN
  Administered 2020-04-14 (×2): 1 via TOPICAL

## 2020-04-14 MED ORDER — THROMBIN 5000 UNITS EX SOLR
OROMUCOSAL | Status: DC | PRN
  Administered 2020-04-14: 5 mL via TOPICAL

## 2020-04-14 MED ORDER — ROCURONIUM BROMIDE 10 MG/ML (PF) SYRINGE
PREFILLED_SYRINGE | INTRAVENOUS | Status: DC | PRN
  Administered 2020-04-14: 70 mg via INTRAVENOUS
  Administered 2020-04-14: 30 mg via INTRAVENOUS

## 2020-04-14 MED ORDER — FENTANYL CITRATE (PF) 250 MCG/5ML IJ SOLN
INTRAMUSCULAR | Status: DC | PRN
  Administered 2020-04-14 (×2): 50 ug via INTRAVENOUS
  Administered 2020-04-14: 100 ug via INTRAVENOUS

## 2020-04-14 MED ORDER — ONDANSETRON HCL 4 MG/2ML IJ SOLN: 4.0000 mg | Freq: Once | INTRAMUSCULAR | Status: DC | PRN

## 2020-04-14 MED ORDER — SODIUM CHLORIDE 0.9 % IR SOLN
Status: DC | PRN
  Administered 2020-04-14 (×2): 1000 mL

## 2020-04-14 MED ORDER — LIDOCAINE 2% (20 MG/ML) 5 ML SYRINGE
INTRAMUSCULAR | Status: AC
  Filled 2020-04-14: qty 5

## 2020-04-14 MED ORDER — POLYETHYLENE GLYCOL 3350 17 G PO PACK: 17.0000 g | PACK | Freq: Every day | ORAL | Status: DC | PRN

## 2020-04-14 MED ORDER — DOCUSATE SODIUM 100 MG PO CAPS
100.0000 mg | ORAL_CAPSULE | Freq: Two times a day (BID) | ORAL | Status: DC
  Administered 2020-04-14 – 2020-04-16 (×4): 100 mg via ORAL
  Filled 2020-04-14 (×4): qty 1

## 2020-04-14 SURGICAL SUPPLY — 109 items
ATTRACTOMAT 16X20 MAGNETIC DRP (DRAPES) IMPLANT
BAND RUBBER #18 3X1/16 STRL (MISCELLANEOUS) IMPLANT
BENZOIN TINCTURE PRP APPL 2/3 (GAUZE/BANDAGES/DRESSINGS) IMPLANT
BLADE RAD40 ROTATE 4M 4 5PK (BLADE) IMPLANT
BLADE RAD40 ROTATE 4M 4MM 5PK (BLADE)
BLADE ROTATE TRICUT 4MX13CM M4 (BLADE) ×1
BLADE ROTATE TRICUT 4X13 M4 (BLADE) ×2 IMPLANT
BLADE SURG 10 STRL SS (BLADE) ×6 IMPLANT
BLADE SURG 11 STRL SS (BLADE) ×3 IMPLANT
BLADE SURG 15 STRL LF DISP TIS (BLADE) ×1 IMPLANT
BLADE SURG 15 STRL SS (BLADE) ×2
BUR DIAMOND 13CMX5MM 70DEG (BURR)
BUR DIAMOND 13X5 70D (BURR) IMPLANT
BUR DIAMOND 15CMX5MM 15SINUS (BURR)
BUR DIAMOND CURV 15X5 15D (BURR) IMPLANT
BUR TAPER CHOANAL ATRESIA 30K (BURR) ×3 IMPLANT
CABLE BIPOLOR RESECTION CORD (MISCELLANEOUS) IMPLANT
CANISTER SUCT 3000ML PPV (MISCELLANEOUS) ×6 IMPLANT
CLOSURE WOUND 1/2 X4 (GAUZE/BANDAGES/DRESSINGS)
COAGULATOR SUCT 8FR VV (MISCELLANEOUS) ×3 IMPLANT
COVER BACK TABLE 60X90IN (DRAPES) IMPLANT
COVER MAYO STAND STRL (DRAPES) ×3 IMPLANT
COVER WAND RF STERILE (DRAPES) IMPLANT
DRAPE HALF SHEET 40X57 (DRAPES) IMPLANT
DRAPE INCISE IOBAN 66X45 STRL (DRAPES) ×3 IMPLANT
DRAPE MICROSCOPE LEICA (MISCELLANEOUS) IMPLANT
DRAPE SURG 17X23 STRL (DRAPES) IMPLANT
DRESSING NASAL POPE 10X1.5X2.5 (GAUZE/BANDAGES/DRESSINGS) IMPLANT
DRSG NASAL POPE 10X1.5X2.5 (GAUZE/BANDAGES/DRESSINGS)
DRSG NASOPORE 8CM (GAUZE/BANDAGES/DRESSINGS) ×3 IMPLANT
DURAPREP 26ML APPLICATOR (WOUND CARE) ×3 IMPLANT
ELECT COATED BLADE 2.86 ST (ELECTRODE) IMPLANT
ELECT NEEDLE TIP 2.8 STRL (NEEDLE) ×3 IMPLANT
ELECT REM PT RETURN 9FT ADLT (ELECTROSURGICAL) ×3
ELECTRODE REM PT RTRN 9FT ADLT (ELECTROSURGICAL) ×1 IMPLANT
GAUZE PACKING FOLDED 2  STR (GAUZE/BANDAGES/DRESSINGS)
GAUZE PACKING FOLDED 2 STR (GAUZE/BANDAGES/DRESSINGS) IMPLANT
GAUZE SPONGE 2X2 8PLY STRL LF (GAUZE/BANDAGES/DRESSINGS) IMPLANT
GAUZE SPONGE 4X4 12PLY STRL (GAUZE/BANDAGES/DRESSINGS) IMPLANT
GLOVE BIO SURGEON STRL SZ 6.5 (GLOVE) ×4 IMPLANT
GLOVE BIO SURGEON STRL SZ7.5 (GLOVE) ×6 IMPLANT
GLOVE BIO SURGEONS STRL SZ 6.5 (GLOVE) ×2
GLOVE BIOGEL M 7.0 STRL (GLOVE) ×6 IMPLANT
GLOVE BIOGEL PI IND STRL 6.5 (GLOVE) ×3 IMPLANT
GLOVE BIOGEL PI IND STRL 7.5 (GLOVE) ×1 IMPLANT
GLOVE BIOGEL PI INDICATOR 6.5 (GLOVE) ×6
GLOVE BIOGEL PI INDICATOR 7.5 (GLOVE) ×2
GLOVE EXAM NITRILE LRG STRL (GLOVE) IMPLANT
GLOVE EXAM NITRILE XL STR (GLOVE) IMPLANT
GLOVE EXAM NITRILE XS STR PU (GLOVE) IMPLANT
GLOVE SURG SS PI 6.0 STRL IVOR (GLOVE) ×9 IMPLANT
GOWN STRL REUS W/ TWL LRG LVL3 (GOWN DISPOSABLE) ×5 IMPLANT
GOWN STRL REUS W/ TWL XL LVL3 (GOWN DISPOSABLE) IMPLANT
GOWN STRL REUS W/TWL 2XL LVL3 (GOWN DISPOSABLE) IMPLANT
GOWN STRL REUS W/TWL LRG LVL3 (GOWN DISPOSABLE) ×10
GOWN STRL REUS W/TWL XL LVL3 (GOWN DISPOSABLE)
HEMOSTAT POWDER KIT SURGIFOAM (HEMOSTASIS) ×3 IMPLANT
HEMOSTAT SURGICEL 2X14 (HEMOSTASIS) IMPLANT
KIT BASIN OR (CUSTOM PROCEDURE TRAY) ×3 IMPLANT
KIT DRAIN CSF ACCUDRAIN (MISCELLANEOUS) IMPLANT
KIT TURNOVER KIT B (KITS) ×3 IMPLANT
KNIFE ARACHNOID DISP AM-21-S (BLADE) IMPLANT
NEEDLE HYPO 25GX1X1/2 BEV (NEEDLE) IMPLANT
NEEDLE HYPO 25X1 1.5 SAFETY (NEEDLE) ×3 IMPLANT
NEEDLE SPNL 22GX3.5 QUINCKE BK (NEEDLE) ×3 IMPLANT
NEEDLE SPNL 25GX3.5 QUINCKE BL (NEEDLE) IMPLANT
NS IRRIG 1000ML POUR BTL (IV SOLUTION) ×3 IMPLANT
PACK CRANIOTOMY CUSTOM (CUSTOM PROCEDURE TRAY) ×3 IMPLANT
PAD ARMBOARD 7.5X6 YLW CONV (MISCELLANEOUS) ×9 IMPLANT
PATTIES SURGICAL .25X.25 (GAUZE/BANDAGES/DRESSINGS) IMPLANT
PATTIES SURGICAL .5 X.5 (GAUZE/BANDAGES/DRESSINGS) IMPLANT
PATTIES SURGICAL .5 X3 (DISPOSABLE) IMPLANT
PENCIL BUTTON HOLSTER BLD 10FT (ELECTRODE) ×3 IMPLANT
PROBE FOR NEUROSURGERY (MISCELLANEOUS) IMPLANT
SEALANT ADHERUS EXTEND TIP (MISCELLANEOUS) IMPLANT
SHEATH ENDOSCRUB 0 DEG (SHEATH) ×3 IMPLANT
SHEATH ENDOSCRUB 30 DEG (SHEATH) IMPLANT
SHEATH ENDOSCRUB 45 DEG (SHEATH) IMPLANT
SPECIMEN JAR SMALL (MISCELLANEOUS) IMPLANT
SPLINT NASAL DOYLE BI-VL (GAUZE/BANDAGES/DRESSINGS) IMPLANT
SPONGE GAUZE 2X2 STER 10/PKG (GAUZE/BANDAGES/DRESSINGS)
SPONGE LAP 4X18 RFD (DISPOSABLE) IMPLANT
SPONGE NEURO XRAY DETECT 1X3 (DISPOSABLE) ×3 IMPLANT
SPONGE SURGIFOAM ABS GEL SZ50 (HEMOSTASIS) ×3 IMPLANT
STAPLER SKIN PROX WIDE 3.9 (STAPLE) IMPLANT
STRIP CLOSURE SKIN 1/2X4 (GAUZE/BANDAGES/DRESSINGS) IMPLANT
SUT 5.0 PDS RB-1 (SUTURE)
SUT BONE WAX W31G (SUTURE) IMPLANT
SUT ETHILON 3 0 FSL (SUTURE) IMPLANT
SUT ETHILON 3 0 PS 1 (SUTURE) IMPLANT
SUT ETHILON 6 0 P 1 (SUTURE) IMPLANT
SUT PDS AB 4-0 RB1 27 (SUTURE) IMPLANT
SUT PDS PLUS AB 5-0 RB-1 (SUTURE) IMPLANT
SUT PLAIN 4 0 ~~LOC~~ 1 (SUTURE) IMPLANT
SUT VIC AB 4-0 P-3 18X BRD (SUTURE) IMPLANT
SUT VIC AB 4-0 P3 18 (SUTURE)
SYR CONTROL 10ML LL (SYRINGE) ×3 IMPLANT
TOWEL GREEN STERILE (TOWEL DISPOSABLE) ×3 IMPLANT
TOWEL GREEN STERILE FF (TOWEL DISPOSABLE) ×3 IMPLANT
TRACKER ENT INSTRUMENT (MISCELLANEOUS) ×3 IMPLANT
TRACKER ENT PATIENT (MISCELLANEOUS) ×3 IMPLANT
TRAP SPECIMEN MUCUS 40CC (MISCELLANEOUS) ×3 IMPLANT
TRAY ENT MC OR (CUSTOM PROCEDURE TRAY) ×3 IMPLANT
TRAY FOLEY MTR SLVR 16FR STAT (SET/KITS/TRAYS/PACK) ×3 IMPLANT
TUBE CONNECTING 12'X1/4 (SUCTIONS) ×1
TUBE CONNECTING 12X1/4 (SUCTIONS) ×2 IMPLANT
TUBING EXTENTION W/L.L. (IV SETS) ×3 IMPLANT
TUBING STRAIGHTSHOT EPS 5PK (TUBING) ×3 IMPLANT
WATER STERILE IRR 1000ML POUR (IV SOLUTION) ×3 IMPLANT

## 2020-04-14 NOTE — Progress Notes (Signed)
Patient to 4N32 at 1155 with following items at bedside: Glasses in case Shirt Hooded zip up [place], [place]

## 2020-04-14 NOTE — Op Note (Signed)
Operative Note:  ENDOSCOPIC TRANSSPHENOIDAL PITUITARY RESECTION WITH NAVIGATION      Patient: Frederick Mendez  Medical record number: [id number]  Date:04/14/2020  Pre-operative Indications: 1.  Pituitary Mass     2.  Right ethmoid sinus cyst        Postoperative Indications: Same  Surgical Procedure: 1. Endoscopic transsphenoidal pituitary resection with intraoperative navigation    2. Right endoscopic total ethmoidectomy      Anesthesia: GET  Surgeon: Delsa Bern, M.D.  Neurosurgeon: Dr. Zada Finders  Complications: None  EBL: 100 cc  Findings: Right posterior ethmoid sinus cyst removed endoscopically. Naso-pore sphenoid packing placed.  Note: The neurosurgical component of the operative procedure is dictated as a separate operative note.   Brief History: The patient is a 39 y.o. male with a history of pituitary mass. The patient has a history of visual changes, patient underwent MRI scan which showed a pituitary mass. The patient was referred to Dr. Zada Finders for neurosurgical evaluation.  Patient seen by me at [facility] preoperatively with review of nasal anatomy and sinus CT scan for navigation. Incidental finding on sinus CT scan showed a isolated right posterior ethmoid sinus cyst, no other significant sinus disease. Given the patient's history and findings, the above surgical procedures were recommended, risks and benefits were discussed in detail with the patient.  They understand and agree with our plan for surgery which is scheduled at [facility] under general anesthesia.  Surgical Procedure: The patient is brought to the neurosurgical operating room on 04/14/2020 and placed in supine position on the operating table. General endotracheal anesthesia was established without difficulty. When the patient was adequately anesthetized, surgical timeout was performed with correct identification of the patient and the surgical procedure. The patient's nose was then  injected with nine cc of 1% lidocaine 1:100,000 dilution epinephrine which was injected in a submucosal fashion. The patient's nose was then packed with Afrin-soaked cottonoid pledgets were left in place for approximately 10 minutes to allow for vasoconstriction and hemostasis.  The Xomed Fusion navigation headgear was applied and anatomic and surgical landmarks were identified and confirmed, navigation was used throughout the sinus component of the surgical procedure.  With the patient prepped draped and prepared for surgery, nasal endoscopy was performed on the patient's right.  The middle turbinate was carefully lateralized to allow access to the posterior aspect of the nasal passageway.  The right sphenoid sinus ostium was identified.  The inferior aspect of the superior turbinate was then resected with through-cutting forceps and a microdebrider.  The right sphenoid sinus ostium was enlarged in a superior and lateral direction using the microdebrider and through-cutting forceps to create a widely patent ostium.  Nasal endoscopy on the patient's left-hand side was then undertaken.  The left middle turbinate was lateralized and the posterior nasal cavity was visualized with identification of the left sphenoid sinus ostium using navigation.  The inferior aspect of the superior turbinate was resected and the sinus ostium was enlarged in the lateral and superior direction to create a wide sphenoid sinus ostium.  A posterior septectomy was then performed with a Surveyor, quantity.  Bone, cartilage and soft tissue was then resected to create a wide posterior septotomy.  The anterior face of the sphenoid sinus and sphenoid sinus septum were then resected with a combination of through-cutting forceps, osteotome and microdebrider to allow direct access to the entire posterior aspect of the sphenoid sinus and pituitary fossa.  Sphenoid sinus mucosa overlying the pituitary fossa was elevated  and lateralized.  The anterior  face of the pituitary fossa was demarcated using navigation.  Right total ethmoidectomy performed with 0 degree endoscope. Middle turbinate carefully medialized and microdebrider used to perform anterior to posterior total ethmoidectomy on the right. Cystic mass resected without difficulty, no evidence of active infection or purulent discharge.  With adequate access to the pituitary fossa the neurosurgical component of the procedure was begun by Dr. Zada Finders.  This is dictated as a separate operative report.  Resection of the pituitary tumor was undertaken using direct visualization of the 0 degree endoscope, navigation and blunt and sharp dissection.  With pituitary tumor resection completed, reconstruction was undertaken. No evidence of significant bleeding or spinal fluid leakage.  Surgicel was placed over the pituitary fossa defect and the sphenoid sinus was loosely packed with Naso-pore absorbable nasal packing.  There was no active bleeding and no evidence of spinal fluid leak.  The sphenoid sinus was carefully inspected, no further bleeding along the mucosal margins, sphenoidotomy sites or posterior septectomy.  The patient's nasal cavity was irrigated and suctioned.  Surgical sponge count was correct. An oral gastric tube was passed and the stomach contents were aspirated. Patient was awakened from anesthetic and transferred from the operating room to the recovery room in stable condition. There were no complications and blood loss was 100 cc.   Delsa Bern, M.D. [facility] 04/14/2020

## 2020-04-14 NOTE — Discharge Instructions (Signed)
Sinus/Nasal Instructions:  1. Limited activity 2. Liquid and soft diet 3. May bathe and shower 4. Saline nasal spray - 4 puffs/nostril every hour while awake, begin the morning after surgery 5. Elevate Head of Bed 6. No nose blowing/Open mouth sneeze 7. Alternate Tylenol and ibuprofen every 6 hours as needed for pain.  Call [facility] for any questions or concerns: [phone]

## 2020-04-14 NOTE — Anesthesia Procedure Notes (Signed)
Procedure Name: Intubation Date/Time: 04/14/2020 7:53 AM Performed by: Dorthea Cove, CRNA Pre-anesthesia Checklist: Patient identified, Emergency Drugs available, Suction available and Patient being monitored Patient Re-evaluated:Patient Re-evaluated prior to induction Oxygen Delivery Method: Circle System Utilized Preoxygenation: Pre-oxygenation with 100% oxygen Induction Type: IV induction Ventilation: Mask ventilation without difficulty Grade View: Grade I Tube type: Oral Tube size: 7.5 mm Number of attempts: 1 Airway Equipment and Method: Stylet and Oral airway Placement Confirmation: ETT inserted through vocal cords under direct vision,  positive ETCO2 and breath sounds checked- equal and bilateral Secured at: 23 cm Tube secured with: Tape Dental Injury: Teeth and Oropharynx as per pre-operative assessment

## 2020-04-14 NOTE — Anesthesia Postprocedure Evaluation (Signed)
Anesthesia Post Note  Patient: Frederick Mendez  Procedure(s) Performed: Endoscopic endonasal resection of pituitary mass (N/A Nose) TRANSPHENOIDAL APPROACH EXPOSURE (N/A Nose)     Patient location during evaluation: PACU Anesthesia Type: General Level of consciousness: sedated and patient cooperative Pain management: pain level controlled Vital Signs Assessment: post-procedure vital signs reviewed and stable Respiratory status: spontaneous breathing Cardiovascular status: stable Anesthetic complications: no   No complications documented.  Last Vitals:  Vitals:   04/14/20 1140 04/14/20 1200  BP:    Pulse: 85 86  Resp: 13 10  Temp: 37 C 36.6 C  SpO2: 100% 96%    Last Pain:  Vitals:   04/14/20 1200  TempSrc: Oral  PainSc:                  Nolon Nations

## 2020-04-14 NOTE — Anesthesia Procedure Notes (Addendum)
Arterial Line Insertion Start/End10/29/2021 7:00 AM, 04/14/2020 7:14 AM Performed by: Alain Marion, CRNA, CRNA  Patient location: Pre-op. Preanesthetic checklist: patient identified, IV checked, site marked, risks and benefits discussed, surgical consent, monitors and equipment checked, pre-op evaluation, timeout performed and anesthesia consent Lidocaine 1% used for infiltration Left, radial was placed Catheter size: 20 Fr Hand hygiene performed  and maximum sterile barriers used   Attempts: 2 Procedure performed without using ultrasound guided technique. Following insertion, dressing applied and Biopatch. Post procedure assessment: normal and unchanged  Patient tolerated the procedure well with no immediate complications.

## 2020-04-14 NOTE — H&P (Signed)
Surgical H&P Update  HPI: 39 y.o. man with pituitary adenoma diagnosed after an episode of diplopia, here for eeTSA. No other clear causes of symptoms, tumor is contacting the chiasm and likely invading the R CS. No changes in health since he was last seen. No new complaints, no new changes in vision, no diplopia.  PMHx:  Past Medical History:  Diagnosis Date  . Pituitary adenoma ([place])    FamHx: History reviewed. No pertinent family history. SocHx:  reports that he has never smoked. He has never used smokeless tobacco. He reports current alcohol use of about 3.0 standard drinks of alcohol per week. He reports previous drug use.  Physical Exam: AOx3, PERRL, FS, TM  Strength 5/5 x4, SILTx4, VFF to confrontation  Assesment/Plan: 39 y.o. man with pituitary mass, here for endoscopic endonasal TSA in a combined case with Dr. Wilburn Cornelia. Risks, benefits, and alternatives discussed and the patient would like to continue with surgery.  -OR today -4N post-op  Judith Part, MD 04/14/20 7:08 AM

## 2020-04-14 NOTE — Op Note (Signed)
PATIENT: Frederick Mendez  DAY OF SURGERY: 04/14/20   PRE-OPERATIVE DIAGNOSIS:  Pituitary adenoma   POST-OPERATIVE DIAGNOSIS:  Pituitary adenoma   PROCEDURE:  Endonasal endoscopic transphenoidal resection of pituitary tumor   SURGEON:  Surgeon(s) and Role:    Judith Part, MD - Co-surgeon    [name], MD - Co-surgeon   ANESTHESIA: ETGA   BRIEF HISTORY: This is a 39 year old man who originally presented with diplopia. The only clear cause was a newly diagnosed pituitary adenoma, but without any evidence of apoplexy. The diplopia resolved with steroids and time, but the adenoma is touching the chiasm. It was found to be a non-functioning tumor on lab work. Given the symptomatic episode and the contact with the chiasm, I recommended surgical resection. This was discussed with the patient as well as risks, benefits, and alternatives and wished to proceed with surgical treatment.   OPERATIVE DETAIL: Dr. Wilburn Cornelia performed the nasal portion of the approach and is dictated separately.   For the neurosurgical portion of the case, Dr. Wilburn Cornelia and I used a combination of visual anatomic landmarks and frameless stereotaxy to confirm orientation and anatomy. The dura of the sella was then opened sharply with a #11 blade in a trap door fashion. Tumor was immediately encountered and samples were taken and sent to pathology for analysis. With Dr. Victorio Palm assistance while holding the endoscope, the tumor was then resected in a deliberate fashion with bimanual technique using a combination of ring curettes and suction. It was dissected laterally, then posteriorly. With a good plane developed, ringed curettes were used to remove the inferior portion of the tumor to allow the superior portion to descend into the field. The superior portion was then resected with extra attention paid to the superolateral corners as the diaphragma began to descend. At the end of resection, there was pulsatile  diaphragma in the field. There was no evidence of CSF leakage. Hemostasis was confirmed and Adheris (Stryker) was used to fill the sella. Dr. Wilburn Cornelia then took over to complete the skull base reconstruction portion of the procedure.   EBL:  53mL   DRAINS: none   SPECIMENS: Pituitary tumor   Judith Part, MD 04/14/20 7:10 AM

## 2020-04-14 NOTE — Anesthesia Preprocedure Evaluation (Signed)
Anesthesia Evaluation  Patient identified by MRN, date of birth, ID band Patient awake    Reviewed: Allergy & Precautions, NPO status , Patient's Chart, lab work & pertinent test results  Airway Mallampati: II  TM Distance: >3 FB Neck ROM: Full    Dental  (+) Teeth Intact, Dental Advisory Given   Pulmonary    breath sounds clear to auscultation       Cardiovascular  Rhythm:Regular Rate:Normal     Neuro/Psych    GI/Hepatic   Endo/Other    Renal/GU      Musculoskeletal   Abdominal   Peds  Hematology   Anesthesia Other Findings   Reproductive/Obstetrics                             Anesthesia Physical Anesthesia Plan  ASA: III  Anesthesia Plan: General   Post-op Pain Management:    Induction: Intravenous  PONV Risk Score and Plan: Ondansetron and Dexamethasone  Airway Management Planned: Oral ETT  Additional Equipment: Arterial line  Intra-op Plan:   Post-operative Plan: Extubation in OR  Informed Consent: I have reviewed the patients History and Physical, chart, labs and discussed the procedure including the risks, benefits and alternatives for the proposed anesthesia with the patient or authorized representative who has indicated his/her understanding and acceptance.     Dental advisory given  Plan Discussed with: Anesthesiologist  Anesthesia Plan Comments:         Anesthesia Quick Evaluation

## 2020-04-14 NOTE — Transfer of Care (Signed)
Immediate Anesthesia Transfer of Care Note  Patient: Frederick Mendez  Procedure(s) Performed: Endoscopic endonasal resection of pituitary mass (N/A Nose) TRANSPHENOIDAL APPROACH EXPOSURE (N/A Nose)  Patient Location: PACU  Anesthesia Type:General  Level of Consciousness: awake, alert  and oriented  Airway & Oxygen Therapy: Patient Spontanous Breathing and Patient connected to face mask oxygen  Post-op Assessment: Report given to RN and Post -op Vital signs reviewed and stable  Post vital signs: Reviewed and stable  Last Vitals:  Vitals Value Taken Time  BP 130/98 04/14/20 1052  Temp    Pulse 86 04/14/20 1053  Resp 10 04/14/20 1053  SpO2 99 % 04/14/20 1053  Vitals shown include unvalidated device data.  Last Pain:  Vitals:   04/14/20 0600  TempSrc:   PainSc: 0-No pain      Patients Stated Pain Goal: 4 (09/28/62 3837)  Complications: No complications documented.

## 2020-04-14 NOTE — Consult Note (Signed)
ENT CONSULT:  Reason for Consult: Pituitary tumor Referring Physician:  Dr. Danielle Rankin Mendez is an 39 y.o. male.  HPI: Hx of visual changes, MRI shows pituitary mass  Past Medical History:  Diagnosis Date  . Pituitary adenoma ([place])     History reviewed. No pertinent surgical history.  History reviewed. No pertinent family history.  Social History:  reports that he has never smoked. He has never used smokeless tobacco. He reports current alcohol use of about 3.0 standard drinks of alcohol per week. He reports previous drug use.  Allergies: No Known Allergies  Medications: I have reviewed the patient's current medications.  Results for orders placed or performed during the hospital encounter of 04/12/20 (from the past 48 hour(s))  SARS CORONAVIRUS 2 (TAT 6-24 HRS) Nasopharyngeal Nasopharyngeal Swab     Status: None   Collection Time: 04/12/20  8:11 AM   Specimen: Nasopharyngeal Swab  Result Value Ref Range   SARS Coronavirus 2 NEGATIVE NEGATIVE    Comment: (NOTE) SARS-CoV-2 target nucleic acids are NOT DETECTED.  The SARS-CoV-2 RNA is generally detectable in upper and lower respiratory specimens during the acute phase of infection. Negative results do not preclude SARS-CoV-2 infection, do not rule out co-infections with other pathogens, and should not be used as the sole basis for treatment or other patient management decisions. Negative results must be combined with clinical observations, patient history, and epidemiological information. The expected result is Negative.  Fact Sheet for Patients: SugarRoll.be  Fact Sheet for Healthcare Providers: https://www.woods-mathews.com/  This test is not yet approved or cleared by the [place] FDA and  has been authorized for detection and/or diagnosis of SARS-CoV-2 by FDA under an Emergency Use Authorization (EUA). This EUA will remain  in effect (meaning this test can be used)  for the duration of the COVID-19 declaration under Se ction 564(b)(1) of the Act, 21 U.S.C. section 360bbb-3(b)(1), unless the authorization is terminated or revoked sooner.  Performed at [facility], [place] [address], [place], [place] [address]     No results found.  ROS:ROS  Blood pressure 137/81, pulse 77, temperature 97.9 F (36.6 C), temperature source Oral, resp. rate 17, height 5' 10.5" (1.791 m), weight 87.5 kg, SpO2 97 %.  PHYSICAL EXAM: General appearance - alert, well appearing, and in no distress Eyes - pupils equal and reactive, extraocular eye movements intact Nose - normal and patent, no erythema, discharge or polyps Mouth - mucous membranes moist, pharynx normal without lesions  Studies Reviewed:MRI and sinus CT  Assessment/Plan: Adm for Endoscopic trans-sphenoidal pituitary resection  [name] 04/14/2020, 7:36 AM

## 2020-04-15 ENCOUNTER — Encounter (HOSPITAL_COMMUNITY): Payer: Self-pay | Admitting: Neurological Surgery

## 2020-04-15 LAB — RENAL FUNCTION PANEL
Albumin: 3.7 g/dL (ref 3.5–5.0)
Anion gap: 9 (ref 5–15)
BUN: 13 mg/dL (ref 6–20)
CO2: 23 mmol/L (ref 22–32)
Calcium: 8.8 mg/dL — ABNORMAL LOW (ref 8.9–10.3)
Chloride: 106 mmol/L (ref 98–111)
Creatinine, Ser: 0.86 mg/dL (ref 0.61–1.24)
GFR, Estimated: >60 mL/min (ref >60)
Glucose, Bld: 107 mg/dL — ABNORMAL HIGH (ref 70–99)
Phosphorus: 3.6 mg/dL (ref 2.5–4.6)
Potassium: 3.7 mmol/L (ref 3.5–5.1)
Sodium: 138 mmol/L (ref 135–145)

## 2020-04-15 NOTE — Progress Notes (Signed)
NEUROSURGERY PROGRESS NOTE  Postop day 1 transphenoidal. Doing well. Complains of stuffiness in his nose but no real headache.   Temp:  [97.7 F (36.5 C)-99.2 F (37.3 C)] 98.4 F (36.9 C) (10/30 0400) Pulse Rate:  [81-109] 81 (10/30 0700) Resp:  [10-21] 11 (10/30 0700) BP: (113-147)/(71-115) 113/71 (10/30 0600) SpO2:  [94 %-100 %] 95 % (10/30 0700) Arterial Line BP: (133-158)/(74-91) 145/84 (10/30 0700)  Plan: Will continue to monitor today. Likely discharge home tomorrow.   Eleonore Chiquito, NP 04/15/2020 7:31 AM

## 2020-04-15 NOTE — Progress Notes (Signed)
   ENT Progress Note: POD #1 s/p Procedure(s): Endoscopic endonasal resection of pituitary mass TRANSPHENOIDAL APPROACH EXPOSURE   Subjective: Congestion w/o ha  Objective: Vital signs in last 24 hours: Temp:  [97.8 F (36.6 C)-99.2 F (37.3 C)] 98.2 F (36.8 C) (10/30 0800) Pulse Rate:  [81-109] 91 (10/30 0800) Resp:  [10-21] 11 (10/30 0800) BP: (113-147)/(71-115) 128/90 (10/30 0800) SpO2:  [94 %-100 %] 97 % (10/30 0800) Arterial Line BP: (137-158)/(79-91) 155/85 (10/30 0800) Weight change:  Last BM Date:  (pta)  Intake/Output from previous day: 10/29 0701 - 10/30 0700 In: 1100 [I.V.:1000; IV Piggyback:100] Out: 1780 [Urine:1680; Blood:100] Intake/Output this shift: Total I/O In: 49.9 [IV Piggyback:49.9] Out: 150 [Urine:150]  Labs: Recent Labs    04/14/20 1205  WBC 9.8  HGB 14.5  HCT 43.4  PLT 179   Recent Labs    04/15/20 0632  NA 138  K 3.7  CL 106  CO2 23  GLUCOSE 107*  BUN 13  CALCIUM 8.8*    Studies/Results: No results found.   PHYSICAL EXAM: No bleeding or d/c EOMI   Assessment/Plan: Stable POD1  Cont obs - plan d/c per NS 10/31 if cont improvement F/U [place] [facility] ~2 wks as OP. Cont saline nasal spray, nasal precautions and po Abx as prescribed    [name] 04/15/2020, 11:09 AM

## 2020-04-16 MED ORDER — HYDROCODONE-ACETAMINOPHEN 5-325 MG PO TABS
1.0000 | ORAL_TABLET | ORAL | 0 refills | Status: AC | PRN
Start: 2020-04-16 — End: ?

## 2020-04-16 NOTE — Progress Notes (Signed)
Patient ordered for discharge to home. All discharge instructions reviewed with patient with good understanding verbalized. PIV in both hands removed. Patient escorted to main lobby by this RN, picked up by wife. All belongings taken. VSS

## 2020-04-16 NOTE — Discharge Summary (Signed)
Physician Discharge Summary  Patient ID: Frederick Mendez MRN: [id number] DOB/AGE: 1980-07-18 39 y.o.  Admit date: 04/14/2020 Discharge date: 04/16/2020  Admission Diagnoses: Pituitary adenoma     Discharge Diagnoses: same   Discharged Condition: good  Hospital Course: The patient was admitted on 04/14/2020 and taken to the operating room where the patient underwent transphenoidal resection of pituitary tumor. The patient tolerated the procedure well and was taken to the recovery room and then to the icu in stable condition. The hospital course was routine. There were no complications. The wound remained clean dry and intact. Pt had appropriate head soreness.The patient remained afebrile with stable vital signs, and tolerated a regular diet. The patient continued to increase activities, and pain was well controlled with oral pain medications.   Consults: None  Significant Diagnostic Studies:  Results for orders placed or performed during the hospital encounter of 04/14/20  CBC  Result Value Ref Range   WBC 9.8 4.0 - 10.5 K/uL   RBC 4.70 4.22 - 5.81 MIL/uL   Hemoglobin 14.5 13.0 - 17.0 g/dL   HCT 43.4 39 - 52 %   MCV 92.3 80.0 - 100.0 fL   MCH 30.9 26.0 - 34.0 pg   MCHC 33.4 30.0 - 36.0 g/dL   RDW 11.9 11.5 - 15.5 %   Platelets 179 150 - 400 K/uL   nRBC 0.0 0.0 - 0.2 %  Creatinine, serum  Result Value Ref Range   Creatinine, Ser 0.99 0.61 - 1.24 mg/dL   GFR, Estimated >60 >60 mL/min  Renal function panel  Result Value Ref Range   Sodium 138 135 - 145 mmol/L   Potassium 3.7 3.5 - 5.1 mmol/L   Chloride 106 98 - 111 mmol/L   CO2 23 22 - 32 mmol/L   Glucose, Bld 107 (H) 70 - 99 mg/dL   BUN 13 6 - 20 mg/dL   Creatinine, Ser 0.86 0.61 - 1.24 mg/dL   Calcium 8.8 (L) 8.9 - 10.3 mg/dL   Phosphorus 3.6 2.5 - 4.6 mg/dL   Albumin 3.7 3.5 - 5.0 g/dL   GFR, Estimated >60 >60 mL/min   Anion gap 9 5 - 15  ABO/Rh  Result Value Ref Range   ABO/RH(D)      O POS Performed at [facility], [address], [place], [place] [address]     No results found.  Antibiotics:  Anti-infectives (From admission, onward)   Start     Dose/Rate Route Frequency Ordered Stop   04/15/20 1400  cephALEXin (KEFLEX) capsule 500 mg        500 mg Oral Every 8 hours 04/14/20 1204     04/14/20 1600  ceFAZolin (ANCEF) IVPB 1 g/50 mL premix        1 g 100 mL/hr over 30 Minutes Intravenous Every 8 hours 04/14/20 1204 04/15/20 0909   04/14/20 1300  ceFAZolin (ANCEF) IVPB 2g/100 mL premix  Status:  Discontinued        2 g 200 mL/hr over 30 Minutes Intravenous Every 8 hours 04/14/20 1204 04/14/20 1217   04/14/20 0600  ceFAZolin (ANCEF) IVPB 2g/100 mL premix        2 g 200 mL/hr over 30 Minutes Intravenous On call to O.R. 04/14/20 0547 04/14/20 0800   04/14/20 0000  cephALEXin (KEFLEX) 500 MG capsule        500 mg Oral 3 times daily 04/14/20 1035 04/24/20 2359      Discharge Exam: Blood pressure 132/82, pulse 93, temperature  98.2 F (36.8 C), resp. rate 17, height 5' 10.5" (1.791 m), weight 87.5 kg, SpO2 99 %. Neurologic: Grossly normal ambualting and voiding well  Discharge Medications:   Allergies as of 04/16/2020   No Known Allergies     Medication List    STOP taking these medications   pantoprazole 40 MG tablet Commonly known as: Protonix     TAKE these medications   cephALEXin 500 MG capsule Commonly known as: Keflex Take 1 capsule (500 mg total) by mouth 3 (three) times daily for 10 days.   HYDROcodone-acetaminophen 5-325 MG tablet Commonly known as: NORCO/VICODIN Take 1 tablet by mouth every 4 (four) hours as needed for moderate pain.   ibuprofen 200 MG tablet Commonly known as: ADVIL Take 400 mg by mouth every 6 (six) hours as needed for headache or moderate pain.       Disposition: home   Final Dx: transphenoidal resection of pituitary tumor  Discharge Instructions    Call MD for:  difficulty breathing, headache or visual disturbances   Complete by: As  directed    Call MD for:  hives   Complete by: As directed    Call MD for:  persistant nausea and vomiting   Complete by: As directed    Call MD for:  redness, tenderness, or signs of infection (pain, swelling, redness, odor or green/yellow discharge around incision site)   Complete by: As directed    Call MD for:  severe uncontrolled pain   Complete by: As directed    Call MD for:  temperature >100.4   Complete by: As directed    Diet - low sodium heart healthy   Complete by: As directed    Increase activity slowly   Complete by: As directed    No wound care   Complete by: As directed        Follow-up Information    [name], MD. Schedule an appointment as soon as possible for a visit in 2 weeks.   Specialty: Otolaryngology Contact information: [address] [place] [address] [phone]                Signed: Ocie Cornfield [facility] 04/16/2020, 9:39 AM

## 2020-04-17 LAB — SURGICAL PATHOLOGY

## 2021-10-20 IMAGING — MR MR ORBITS WO/W CM
7 of 11 series · 32 of 48 positions shown · IV contrast (gadavist)
Comparison: None.

CLINICAL DATA: Light sensitivity in the right eye. Being treated
for sinus infection. Double vision and right-sided headache.

EXAM:
MRI HEAD AND ORBITS WITHOUT AND WITH CONTRAST
TECHNIQUE: Multiplanar, multiecho pulse sequences of the brain and surrounding
structures were obtained without and with intravenous contrast.
Multiplanar, multiecho pulse sequences of the orbits and surrounding
structures were obtained including fat saturation techniques, before
and after intravenous contrast administration.
CONTRAST:  9mL GADAVIST GADOBUTROL 1 MMOL/ML IV SOLN

[Series 5: T1 · axial · non-contrast · 3.0mm · 0.37mm/px · z∈[-91,+10]mm · 5 of 25 slices shown]
[im 1/25]
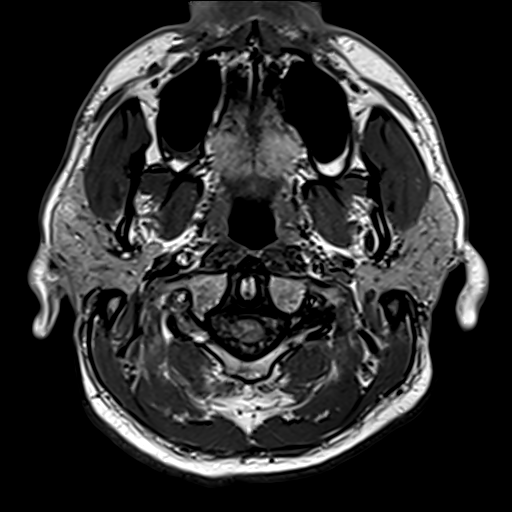
[im 7/25]
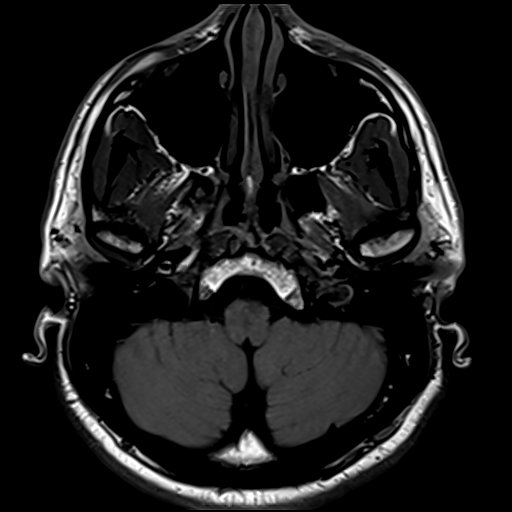
[im 13/25]
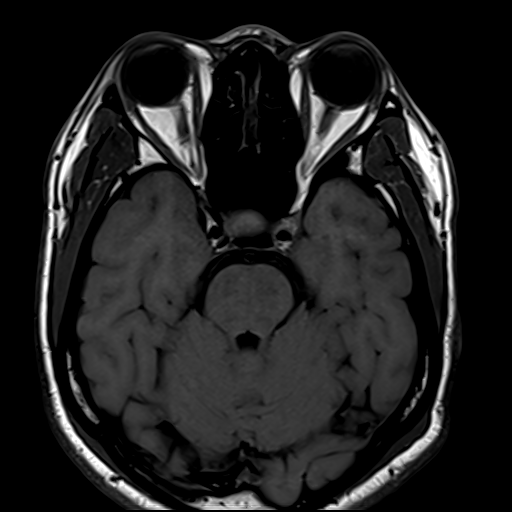
[im 19/25]
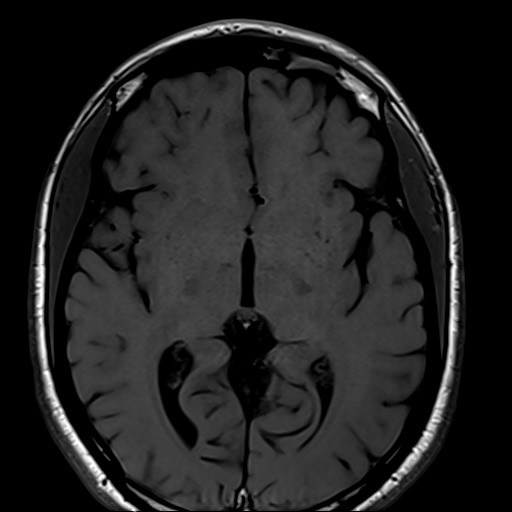
[im 25/25]
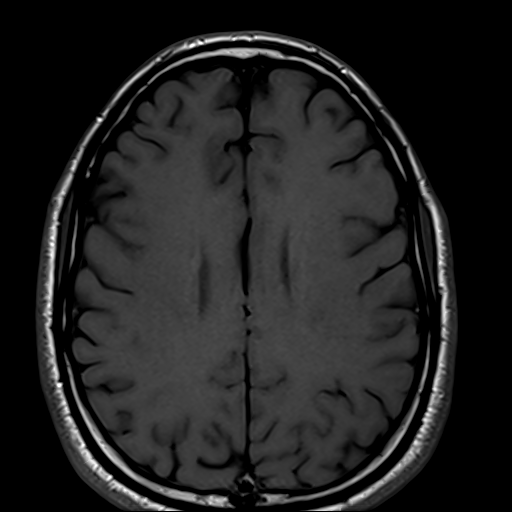

[Series 6: T2 fat-sat · coronal · 3.0mm · 0.54mm/px · 5 of 25 slices shown (1 of 6)]
[im 1/25]
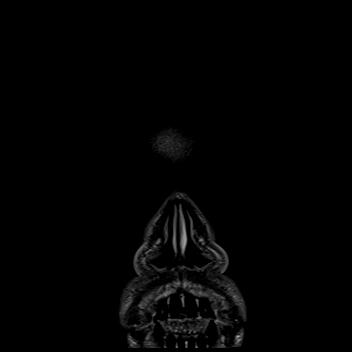
[im 7/25]
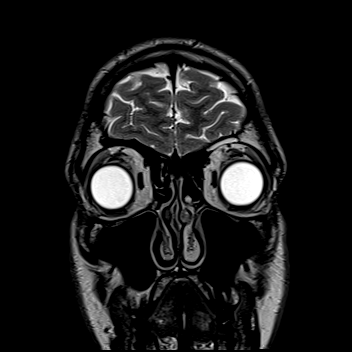
[im 13/25]
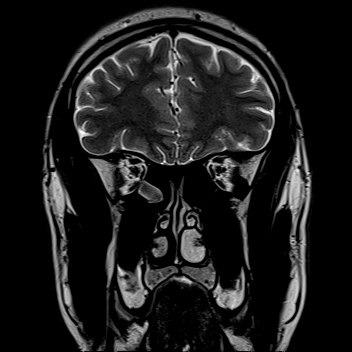
[im 19/25]
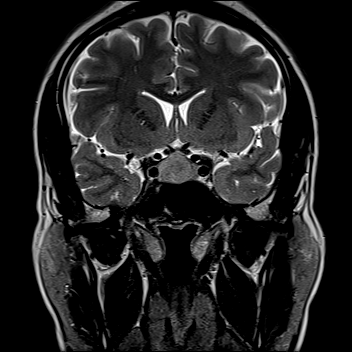
[im 25/25]
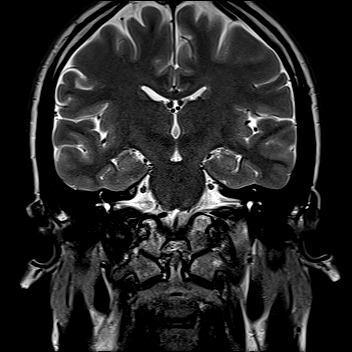

[Series 7: T2 fat-sat · coronal · 3.0mm · 0.54mm/px · 5 of 25 slices shown (2 of 6)]
[im 1/25]
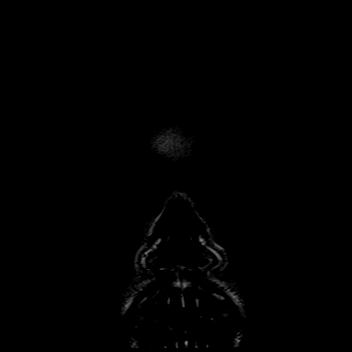
[im 7/25]
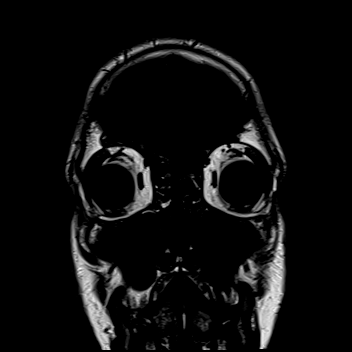
[im 13/25]
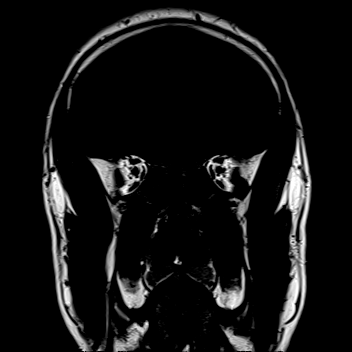
[im 19/25]
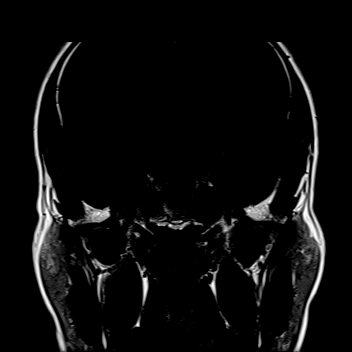
[im 25/25]
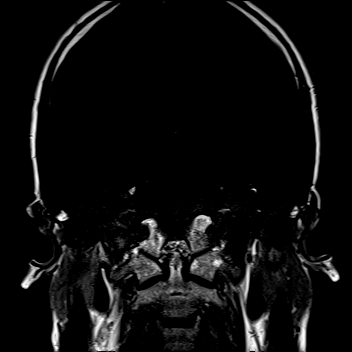

[Series 8: T2 fat-sat · coronal · 3.0mm · 0.54mm/px · 5 of 25 slices shown (3 of 6)]
[im 1/25]
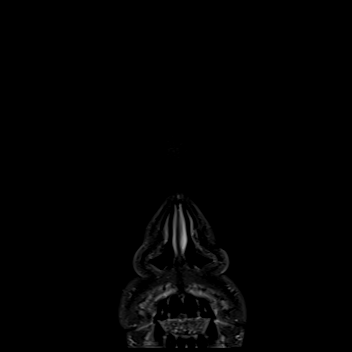
[im 7/25]
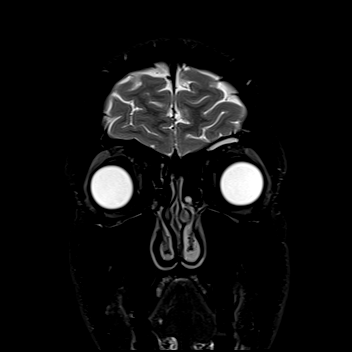
[im 13/25]
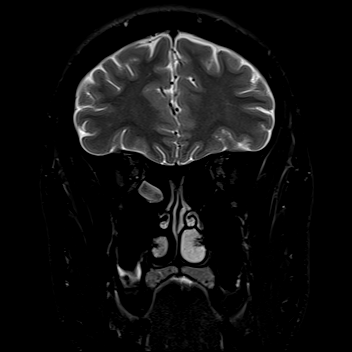
[im 19/25]
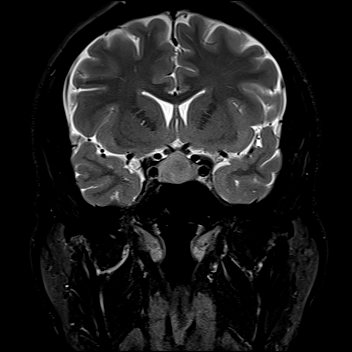
[im 25/25]
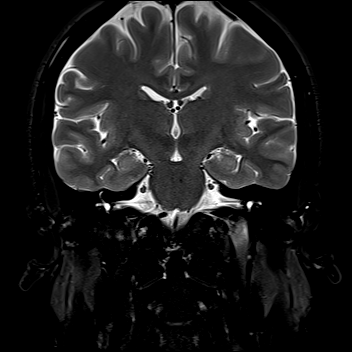

[Series 9: T2 fat-sat · axial · 3.0mm · 0.54mm/px · z∈[-84,+17]mm · 4 of 25 slices shown (4 of 6)]
[im 1/25]
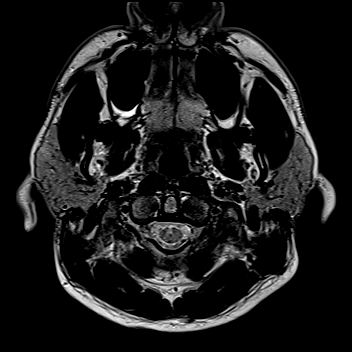
[im 9/25]
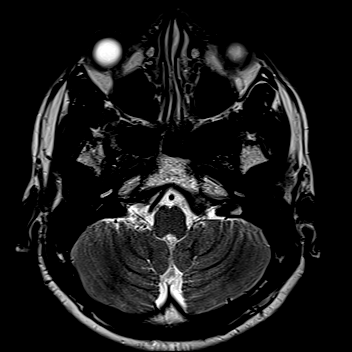
[im 17/25]
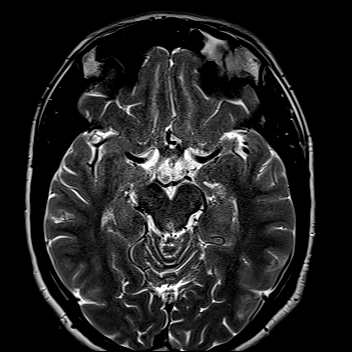
[im 25/25]
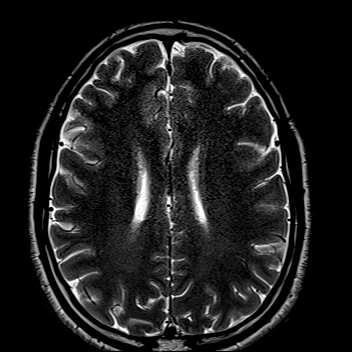

[Series 10: T2 fat-sat · axial · 3.0mm · 0.54mm/px · z∈[-84,+17]mm · 4 of 25 slices shown (5 of 6)]
[im 1/25]
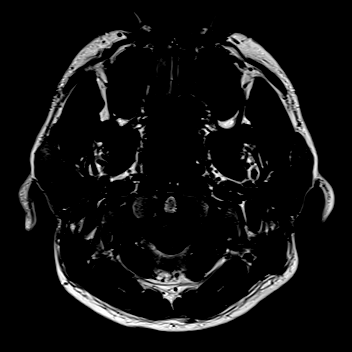
[im 9/25]
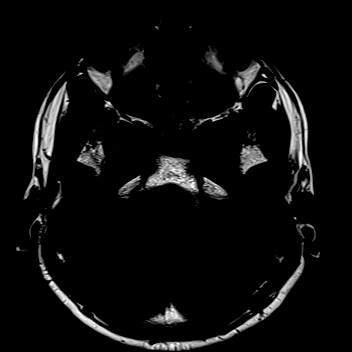
[im 17/25]
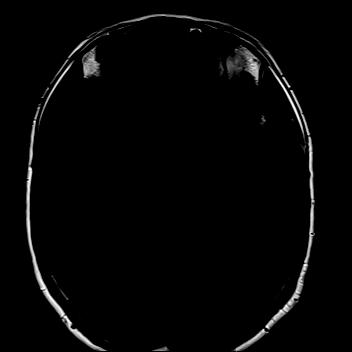
[im 25/25]
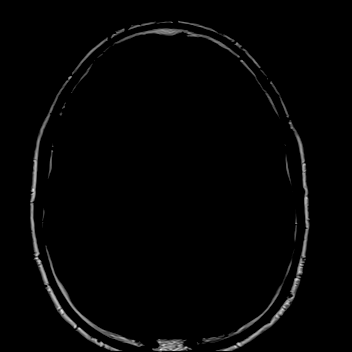

[Series 11: T2 fat-sat · axial · 3.0mm · 0.54mm/px · z∈[-84,+17]mm · 4 of 25 slices shown (6 of 6)]
[im 1/25]
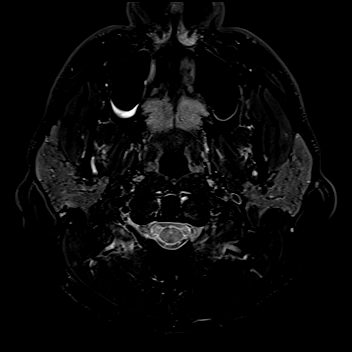
[im 9/25]
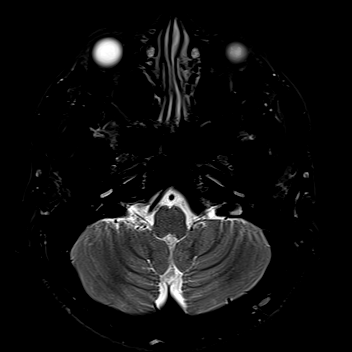
[im 17/25]
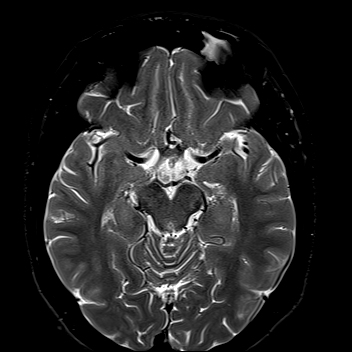
[im 25/25]
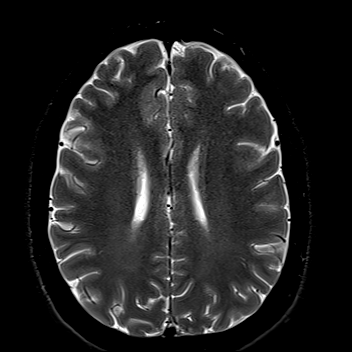

[32 of 48 positions shown; findings below may reference images not displayed]

FINDINGS: MRI HEAD FINDINGS

Brain: 18 x 16 x 13 mm sellar and suprasellar mass that is
indistinguishable from normal pituitary gland. The mass is enhancing
and has a wasted appearance. The mass contacts the optic chiasm in
the suprasellar cistern. No visible cavernous sinus invasion to
correlate with double vision. No notable heterogeneity to suggest
recent hemorrhage.

No acute infarction, hemorrhage, hydrocephalus, extra-axial
collection or white matter disease.

Vascular: These normal flow voids and vascular enhancement

Skull and upper cervical spine: Normal marrow signal

MRI ORBITS FINDINGS

Orbits:  No evidence of mass, inflammation, or venous occlusion.

Visualized sinuses: Opacified right posterior ethmoid air cell and
partial opacification of the left frontal sinus.

Soft tissues: Negative

Limited intracranial: Negative
IMPRESSION: 1. 18 x 16 x 13 mm sellar and suprasellar mass consistent with macro
adenoma. The mass contacts the optic chiasm. No visible cavernous
sinus invasion.
2. Normal appearance of the brain itself.

## 2021-10-20 IMAGING — CT CT ANGIO HEAD-NECK
2 of 13 series · 15 of 47 positions shown · IV contrast (OMNI)
Comparison: Head and orbit MRI plus intracranial MRV earlier today.

CLINICAL DATA: 39-year-old male with pituitary mass compatible with
macroadenoma on MRI earlier today. Diplopia. Query dissection.

EXAM:
CT ANGIOGRAPHY HEAD AND NECK
TECHNIQUE: Multidetector CT imaging of the head and neck was performed using
the standard protocol during bolus administration of intravenous
contrast. Multiplanar CT image reconstructions and MIPs were
obtained to evaluate the vascular anatomy. Carotid stenosis
measurements (when applicable) are obtained utilizing NASCET
criteria, using the distal internal carotid diameter as the
denominator.
CONTRAST:  75mL OMNIPAQUE IOHEXOL 350 MG/ML SOLN

[Series 19: thin · axial · 0.48mm/px · z∈[-385,-36]mm · 14 of 806 slices shown]
[im 54/806  brain]
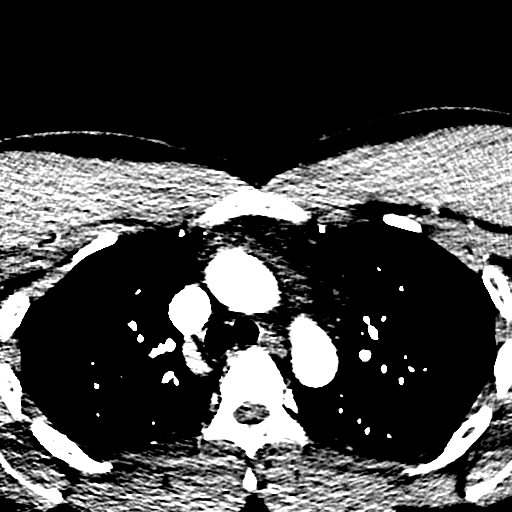
[im 108/806  bone]
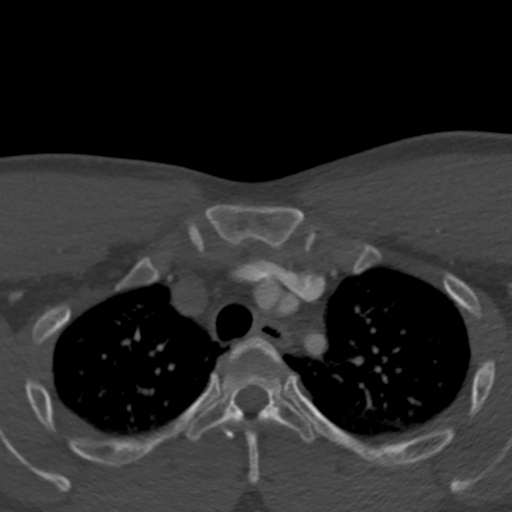
[im 162/806  brain]
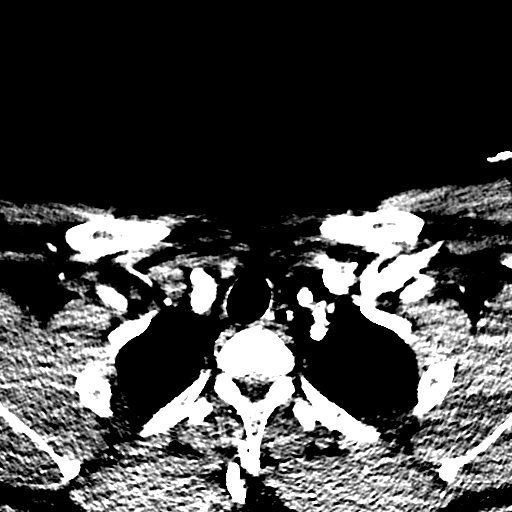
[im 215/806  bone]
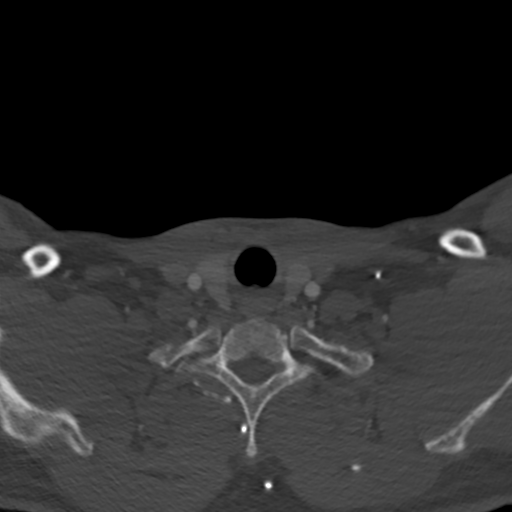
[im 269/806  brain]
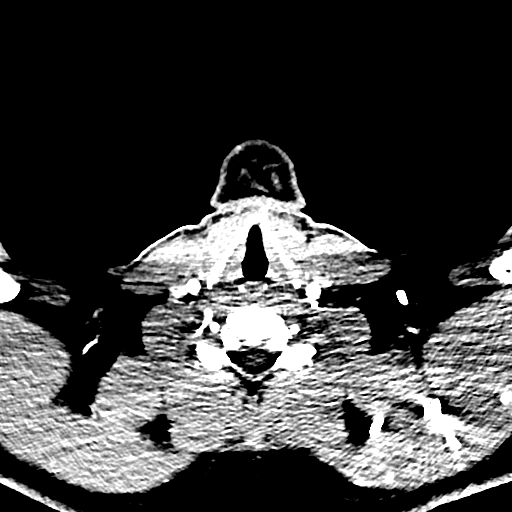
[im 323/806  bone]
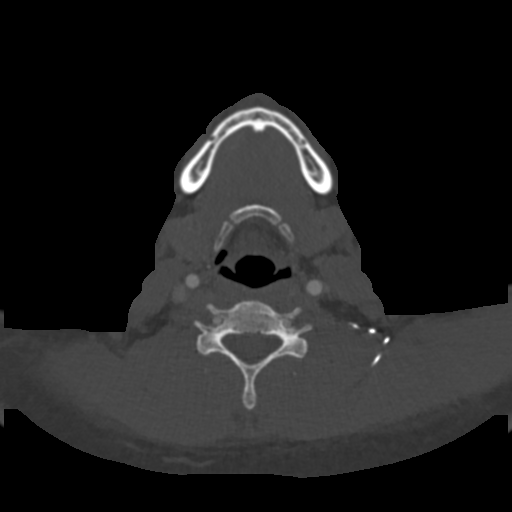
[im 376/806  brain]
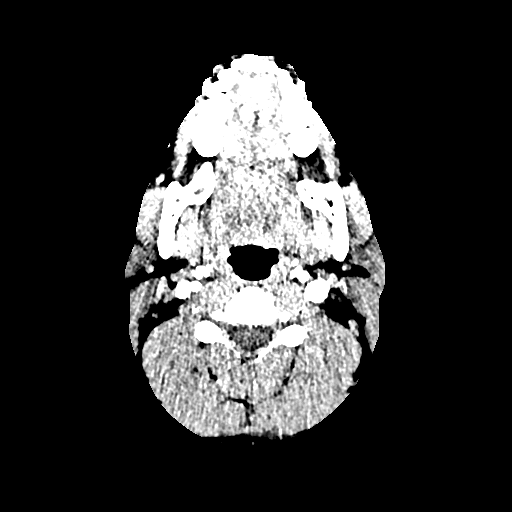
[im 430/806  bone]
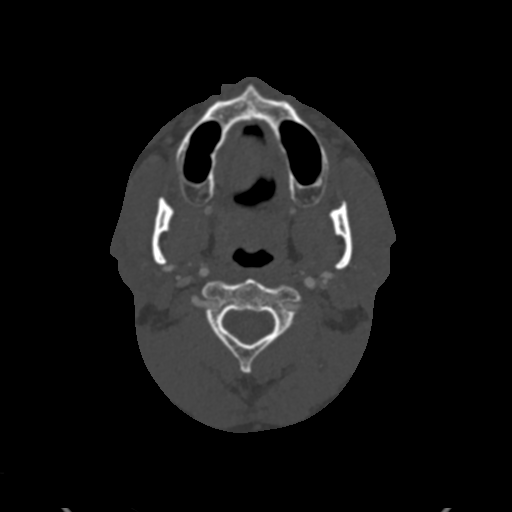
[im 484/806  brain]
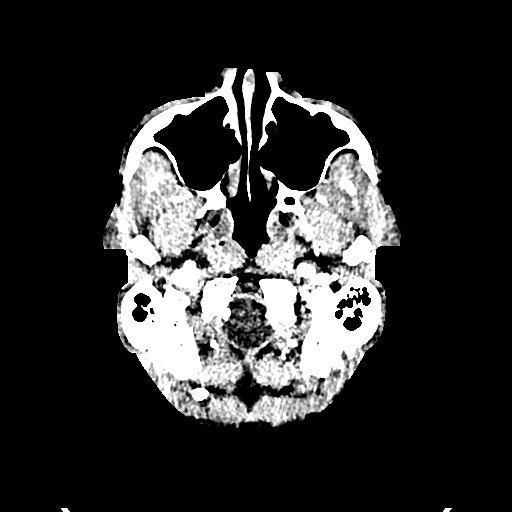
[im 537/806  bone]
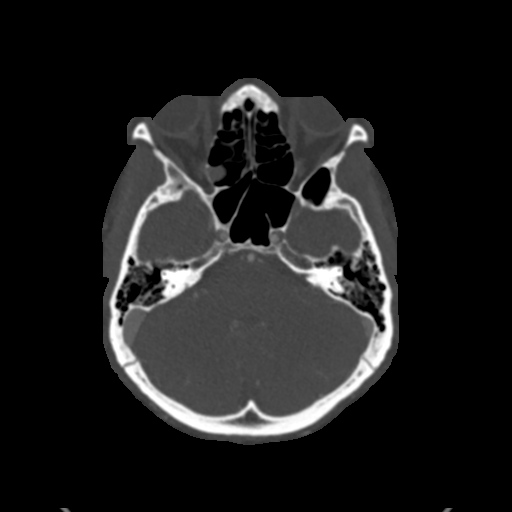
[im 591/806  brain]
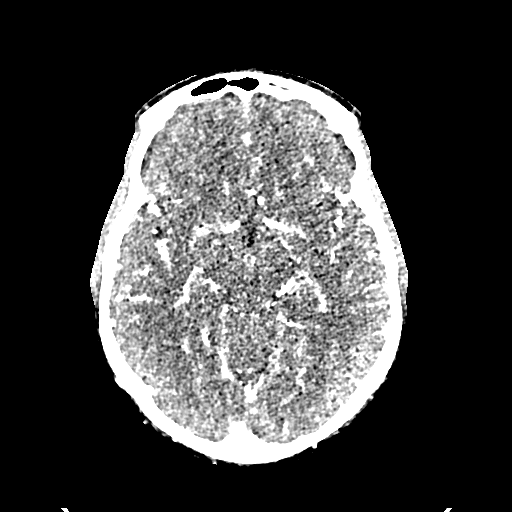
[im 645/806  bone]
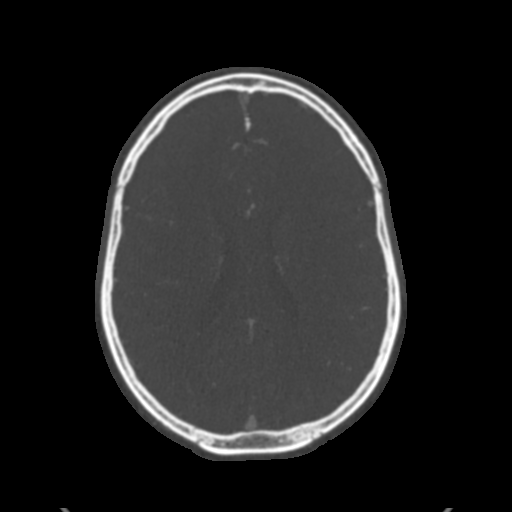
[im 698/806  brain]
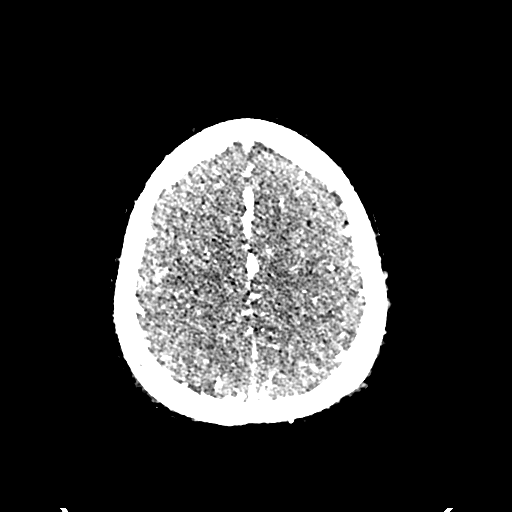
[im 752/806  bone]
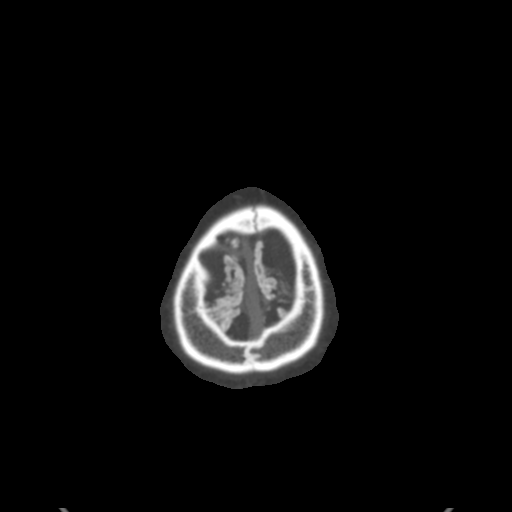

[Series 20: coronal mpr · coronal · 0.44mm/px · 1 of 128 slices shown]
[im 64/128  brain]
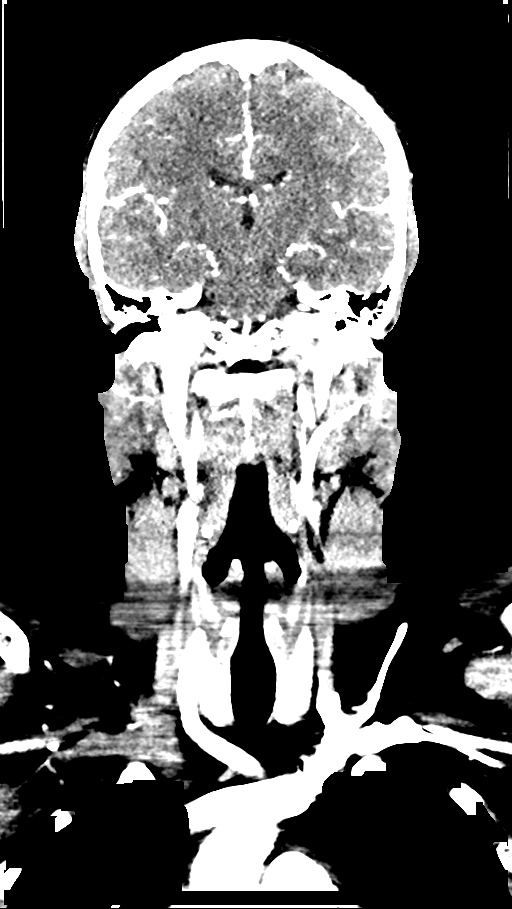

[15 of 47 positions shown; findings below may reference images not displayed]

FINDINGS: CT HEAD

Brain: Hyperdense intra sellar mass with suprasellar extension
corresponding to the MRI finding (series 8, image 10). No other
intracranial mass effect, no midline shift. No ventriculomegaly. No
acute intracranial hemorrhage identified. No cortically based acute
infarct identified. Gray-white matter differentiation is within
normal limits throughout the brain. Incidental dural calcifications.

Calvarium and skull base: No acute osseous abnormality identified.

Paranasal sinuses: Visualized paranasal sinuses and mastoids are
stable and well pneumatized. Hyperplastic paranasal sinuses again
noted.

Orbits: Visualized orbits and scalp soft tissues are within normal
limits.

CTA NECK

Skeleton: No osseous abnormality identified.

Upper chest: Negative.

Other neck: Negative.

Aortic arch: 3 vessel arch configuration. No arch atherosclerosis.

Right carotid system: Negative.

Left carotid system: Negative.

Vertebral arteries:
Normal proximal subclavian arteries and vertebral artery origins.
Both vertebral arteries appear patent and normal to the skull base,
the right is slightly dominant.

CTA HEAD

Posterior circulation: Patent distal vertebral arteries without
plaque or stenosis. The right is slightly dominant. Patent PICA
origins. Patent basilar artery, AICA, SCA, and PCA origins. Small
posterior communicating arteries are present. Bilateral PCA branches
are within normal limits.

Anterior circulation: Both ICA siphons are patent with no plaque or
stenosis. Normal ophthalmic and posterior communicating artery
origins. Normal carotid termini. Normal MCA and ACA origins. The
left A1 is dominant. Anterior communicating artery and bilateral ACA
branches are within normal limits. Left MCA M1 segment and
trifurcation are patent without stenosis. Left MCA branches are
within normal limits. Right MCA M1 bifurcates early without
stenosis. Right MCA branches are within normal limits.

Venous sinuses: Patent. Incidental dural calcifications.

Anatomic variants: Mildly dominant right vertebral artery. Dominant
left ACA A1.

Review of the MIP images confirms the above findings
IMPRESSION: 1. Normal arterial findings on CTA Head and Neck. No dissection,
atherosclerosis, or aneurysm.
2. Stable pituitary mass with suprasellar extension corresponding to
today's MRI finding.
3. No new abnormality identified.
